# Patient Record
Sex: Female | Born: 1965 | Race: White | Hispanic: No | Marital: Married | Smoking: Never smoker
Health system: Southern US, Community
[De-identification: ages and names within clinical notes are randomized; demographics above are authoritative.]

## PROBLEM LIST (undated history)

## (undated) DIAGNOSIS — R51 Headache: Secondary | ICD-10-CM

## (undated) DIAGNOSIS — C801 Malignant (primary) neoplasm, unspecified: Secondary | ICD-10-CM

## (undated) DIAGNOSIS — M797 Fibromyalgia: Secondary | ICD-10-CM

## (undated) DIAGNOSIS — F419 Anxiety disorder, unspecified: Secondary | ICD-10-CM

## (undated) DIAGNOSIS — K219 Gastro-esophageal reflux disease without esophagitis: Secondary | ICD-10-CM

## (undated) DIAGNOSIS — E7800 Pure hypercholesterolemia, unspecified: Secondary | ICD-10-CM

## (undated) DIAGNOSIS — E785 Hyperlipidemia, unspecified: Secondary | ICD-10-CM

## (undated) HISTORY — DX: Pure hypercholesterolemia, unspecified: E78.00

## (undated) HISTORY — PX: ENDOMETRIAL ABLATION: SHX621

## (undated) HISTORY — PX: HERNIA REPAIR: SHX51

## (undated) HISTORY — PX: WISDOM TOOTH EXTRACTION: SHX21

## (undated) HISTORY — PX: TUBAL LIGATION: SHX77

## (undated) HISTORY — DX: Hyperlipidemia, unspecified: E78.5

## (undated) HISTORY — DX: Fibromyalgia: M79.7

---

## 1992-03-17 HISTORY — PX: GYNECOLOGIC CRYOSURGERY: SHX857

## 2000-05-04 ENCOUNTER — Inpatient Hospital Stay (HOSPITAL_COMMUNITY): Admission: AD | Admit: 2000-05-04 | Discharge: 2000-05-04 | Payer: Self-pay | Admitting: Gynecology

## 2000-05-10 ENCOUNTER — Inpatient Hospital Stay (HOSPITAL_COMMUNITY): Admission: AD | Admit: 2000-05-10 | Discharge: 2000-05-10 | Payer: Self-pay | Admitting: *Deleted

## 2000-05-12 ENCOUNTER — Encounter (INDEPENDENT_AMBULATORY_CARE_PROVIDER_SITE_OTHER): Payer: Self-pay

## 2000-05-12 ENCOUNTER — Inpatient Hospital Stay (HOSPITAL_COMMUNITY): Admission: AD | Admit: 2000-05-12 | Discharge: 2000-05-17 | Payer: Self-pay | Admitting: Gynecology

## 2000-05-16 ENCOUNTER — Encounter: Payer: Self-pay | Admitting: Gynecology

## 2000-08-14 ENCOUNTER — Ambulatory Visit (HOSPITAL_BASED_OUTPATIENT_CLINIC_OR_DEPARTMENT_OTHER): Admission: RE | Admit: 2000-08-14 | Discharge: 2000-08-14 | Payer: Self-pay | Admitting: Surgery

## 2001-10-28 ENCOUNTER — Ambulatory Visit (HOSPITAL_COMMUNITY): Admission: RE | Admit: 2001-10-28 | Discharge: 2001-10-28 | Payer: Self-pay | Admitting: *Deleted

## 2001-10-28 ENCOUNTER — Encounter: Payer: Self-pay | Admitting: *Deleted

## 2003-10-05 ENCOUNTER — Other Ambulatory Visit: Admission: RE | Admit: 2003-10-05 | Discharge: 2003-10-05 | Payer: Self-pay | Admitting: Gynecology

## 2004-10-04 ENCOUNTER — Emergency Department (HOSPITAL_COMMUNITY): Admission: EM | Admit: 2004-10-04 | Discharge: 2004-10-05 | Payer: Self-pay | Admitting: Emergency Medicine

## 2006-01-20 ENCOUNTER — Other Ambulatory Visit: Admission: RE | Admit: 2006-01-20 | Discharge: 2006-01-20 | Payer: Self-pay | Admitting: Gynecology

## 2006-10-23 ENCOUNTER — Ambulatory Visit (HOSPITAL_COMMUNITY): Admission: RE | Admit: 2006-10-23 | Discharge: 2006-10-23 | Payer: Self-pay | Admitting: Gynecology

## 2006-12-07 ENCOUNTER — Encounter: Admission: RE | Admit: 2006-12-07 | Discharge: 2006-12-07 | Payer: Self-pay

## 2007-01-16 HISTORY — PX: OTHER SURGICAL HISTORY: SHX169

## 2007-08-07 ENCOUNTER — Emergency Department (HOSPITAL_COMMUNITY): Admission: EM | Admit: 2007-08-07 | Discharge: 2007-08-07 | Payer: Self-pay | Admitting: Family Medicine

## 2007-08-07 ENCOUNTER — Ambulatory Visit (HOSPITAL_COMMUNITY): Admission: RE | Admit: 2007-08-07 | Discharge: 2007-08-07 | Payer: Self-pay | Admitting: Family Medicine

## 2007-10-10 ENCOUNTER — Emergency Department (HOSPITAL_COMMUNITY): Admission: EM | Admit: 2007-10-10 | Discharge: 2007-10-10 | Payer: Self-pay | Admitting: Emergency Medicine

## 2008-01-27 ENCOUNTER — Other Ambulatory Visit: Admission: RE | Admit: 2008-01-27 | Discharge: 2008-01-27 | Payer: Self-pay | Admitting: Gynecology

## 2008-01-27 ENCOUNTER — Encounter: Payer: Self-pay | Admitting: Gynecology

## 2008-01-27 ENCOUNTER — Ambulatory Visit: Payer: Self-pay | Admitting: Gynecology

## 2008-01-31 ENCOUNTER — Ambulatory Visit: Payer: Self-pay | Admitting: Gynecology

## 2008-08-02 ENCOUNTER — Ambulatory Visit: Payer: Self-pay | Admitting: Gynecology

## 2009-09-28 ENCOUNTER — Ambulatory Visit: Payer: Self-pay | Admitting: Gynecology

## 2009-09-28 ENCOUNTER — Other Ambulatory Visit: Admission: RE | Admit: 2009-09-28 | Discharge: 2009-09-28 | Payer: Self-pay | Admitting: Gynecology

## 2010-06-10 ENCOUNTER — Other Ambulatory Visit: Payer: Self-pay | Admitting: Family Medicine

## 2010-06-10 ENCOUNTER — Ambulatory Visit
Admission: RE | Admit: 2010-06-10 | Discharge: 2010-06-10 | Disposition: A | Discharge status: Home / Self Care | Payer: BC Managed Care – PPO | Source: Ambulatory Visit | Attending: Family Medicine | Admitting: Family Medicine

## 2010-06-10 DIAGNOSIS — M25559 Pain in unspecified hip: Secondary | ICD-10-CM

## 2010-08-02 NOTE — Op Note (Signed)
[place]  Patient:    [name], [name]                         MRN: [id number] Proc. Date: 08/14/00 Adm. Date:  [id number] Attending:  Katha Cabal CC:         [facility]  Dr. Frederico Hamman   Operative Report  PREOPERATIVE DIAGNOSIS:  Umbilical hernia.  POSTOPERATIVE DIAGNOSIS:  Umbilical hernia.  PROCEDURE:  Umbilical herniorrhaphy with mesh.  SURGEON:  Thornton Park. Daphine Deutscher, M.D.  ANESTHESIA:  General by endotracheal.  DESCRIPTION OF PROCEDURE:  [name] is a 45 year old lady that after her last baby had a fairly prominent umbilical hernia.  She has some postpartum gravidarum pigmentation along the midline and down into the umbilicus and about a 2 fingerbreadth weakness.  [name] was taken to room 6 at [facility] on May 31 and given general anesthesia.  She got a g of Ancef preoperatively.  The abdomen was prepped with Betadine and draped sterilely.  I made a curvilinear incision beneath her umbilicus and lifted the umbilical skin off of the hernia to a point.  It was pretty well broadly fused and I went ahead and opened the hernia and then took the skin of the umbilicus off this.  I put my finger down into the defect and took the fat and moved it away, but nevertheless was in the abdomen and I felt that placement of mesh at this level would lead to adhesion formation potentially.  Therefore, I cut a piece of mesh to fit and incorporated the mesh on the outside on the exterior aspect of the fascia and placed several 0 Prolene pop-off sutures through and through fascia and closed it in a horizontal fashion.  The middle two were in sort of a vest-over-pants fashion and this did create a nice secure closure using a total of six sutures.  I then injected the area with Marcaine.  I washed with a basin of saline and then tacked the inner aspect of the umbilical fold down to the fascia itself, not to the mesh  and then I closed the skin with 4-0 Vicryl subcuticular and subcutaneously with benzoin and Steri-Strips to give final approximation to the skin.  A sterile dressing was applied.  The patient was given ______ to take for pain and will be seen in the office in two weeks. DD:  08/14/00 TD:  08/14/00 Job: [id number]

## 2010-08-02 NOTE — Discharge Summary (Signed)
[facility]  Patient:    [name], [name]                         MRN: [id number] Adm. Date:  [id number] Disc. Date: [id number] Attending:  Tonye Royalty Dictator:   Antony Contras, NP                           Discharge Summary  DISCHARGE DIAGNOSES:          1. Preterm labor and delivery at 36-1/2 weeks.                               2. History of previous low cervical transverse                                  cesarean section.                               3. Nonreassuring fetal heart tracing.                               4. Arrest of labor.  PROCEDURES:                   Low cervical transverse cesarean section with bilateral tubal sterilization.  HISTORY OF PRESENT ILLNESS:   Patient is a 45 year old gravida 3, para 1-0-1-1 with EDC of June 05, 2000 by ultrasound.  Prenatal risk factors include history of a nonimmune rubella status, previous low cervical transverse cesarean section, fibroid uterus and umbilical hernia.  LABORATORY DATA:              Labs are as follows:  Blood type O positive, antibody screen negative, RPR, HBsAg, HIV nonreactive, rubella nonimmune, GBS is negative.  HOSPITAL COURSE/TREATMENT:    Patient was admitted on February _26, 2002 with increased contractions and was found to be 3-4 cm, 90% effaced, with vertex station.  She quickly progressed to 7-8 cm.  During the second stage of labor, she did experience a nonreassuring fetal heart tracing and it was decided by patient and the Gerome Kokesh to proceed with cesarean section.  Low cervical transverse cesarean section and bilateral tubal sterilization was performed by Dr. Lily Peer under epidural anesthesia.  Patient delivered an Apgar 6/9 female infant weighing 6 pounds 10 ounces.  Findings included multiple leiomyomata, also umbilical cord was wrapped x 3 around the left ankle.  POSTOPERATIVE COURSE:         Patient did sustain a postoperative  temperature elevation to 100.4 but this did resolve spontaneously.  She also had some difficulty with initiating a bowel movement and required magnesium citrate and Dulcolax suppository for this; otherwise, she had a benign postoperative course.  CBC: Hematocrit was 27.3, hemoglobin 9.4, wbc 13.5, platelets 161. She was able to be discharged in satisfactory condition on her fourth postoperative day.  She did receive rubella vaccine prior to discharge.  DISPOSITION:                  Follow up in six weeks.  MEDICATIONS:                  Continue prenatal vitamins and  iron.  Motrin and Tylox for pain. DD:  06/01/00 TD:  06/02/00 Job: [id number]

## 2010-08-02 NOTE — H&P (Signed)
[facility]  PatientTAJUANNA, [name] Visit Number: [id number] MRN: [id number]          Service Type: OBS Location: [facility] Attending Physician:  Wetzel Bjornstad Admit Date:  05/10/2000 Discharge Date: 05/10/2000                           History and Physical  CHIEF COMPLAINT:              Contractions.  HISTORY OF PRESENT ILLNESS:   The patient is a 45 year old gravida 3, para 1, AB1, with a corrected  estimated date of confinement based on ultrasound June 05, 2000.  The patient is currently 36-4/[redacted] weeks gestation.  She presented to [facility] today complaining of having increased contractions.  She has been complaining of contractions on and off for the past two weeks and was seen this weekend in the emergency room at [facility] and was in false labor and was sent home with instructions.  Review of her prenatal records indicated that she has been treated no several occasions for bacterial vaginosis or yeast infection as well as urinary tract infection.  She also has had modified rest at home and cut down on working hours due to the fact that she had straining of her cervical length measurement back at 32-1/[redacted] weeks gestation.  On her arrival to the emergency room today, she was found to be contracting every 1-1/2 to 2 minutes apart and her cervix was found to be 3-4 cm dilated, 90% effaced, vertex presentation, -1 with intact membranes and afebrile.  She is brought to labor and delivery and upon arrival and examination at 2230 hours she was found to be 7-8 cm dilated, completely effaced and 0 station with intact membranes.  She underwent rupture of membranes with clear amniotic fluid.  ALLERGIES:  She is allergic to PENICILLIN, AMPICILLIN, TETRACYCLINE.  PAST SURGICAL HISTORY:  She had a previous lower uterine segment transverse cesarean section secondary to fetal distress and which was attributed to a umbilical cord up around the  newborns neck causing deceleration and resulting in emergency cesarean section.  She did have a spontaneous VBAC back in 1994 as well.  She has had previous history of cryotherapy in 1995 secondary to dysplasia.  She had cystoscopy in 1994 and during the prenatal visit she was noticed also to have a reducible umbilical hernia on an early ultrasound and anterior uterine fibroid.  REVIEW OF SYSTEMS:  See Hollister form.  PHYSICAL EXAMINATION:  VITAL SIGNS:                    Temperature 97.6, pulse 77, respirations 22, blood pressure 127/82.  HEENT:                          Unremarkable.  NECK:                           Supple.  Trachea midline.  No carotid bruits. No thyromegaly.  LUNGS:                          Clear to auscultation without rhonchi or wheezes.  HEART:                          Regular rate and rhythm, no  murmurs or gallops.  BREASTS:                        Breast examination done during the first trimester and was reported to be normal.  ABDOMEN:                        Gravid uterus, 39 cm, vertex presentation by Thayer Ohm maneuver.  Positive fetal heart tones in the 120-130 beat range.  PELVIC:                         Cervix 7-8 cm dilated, complete effacement, 0 station.  EXTREMITIES:                    Deep tendon reflexes 1+, negative clonus and trace edema.  PRENATAL LABORATORY DATA:       Blood type is O positive, negative antibody screen.  VDRL was nonreactive.  Rubella was negative.  Hepatitis B surface antigen and HIV were negative.  Pap smear was normal.  Alpha fetoprotein was normal.  Blood sugar screen was normal and GBS culture was negative. Of note, the patient was tested at 32 weeks for Fifths disease due to the fact that she had been exposed at school.  From recollection of office visit, there was no evidence that she had evidence of acute infection although I do not have clear documentation and a copy of the Hollister form in my  possession at the time of this dictation.  LABORATORY DATA:                Admission labs today are hemoglobin and hematocrit of 12.2 and 35.3 with a platelet count of 188,000.  ASSESSMENT:                     [age]-year-old gravida 3, para 1 at 36-4/[redacted] weeks gestation with an atypical dilatation in active labor. Contractions are every 1-1/2 minutes apart with a reassuring fetal heart rate tracing.  The patient underwent artificial rupture of membranes.  Clear amniotic fluid was present.  Fetal scalp electrode was placed.  Vital signs are stable.  The patient is afebrile.  She had an epidural and was resting comfortably.  Previous cesarean section with documented lower uterine segment transverse incision.  Patient was previously counseled as to benefits, risks, pros and cons of a trial of labor versus repeat cesarean section and opted to proceed with at attempt at [facility].  The GBS culture is negative.  PLAN:                           Anticipate a vaginal delivery.  Follow up visit in six weeks will be coordinated with general surgery for an umbilical herniorrhaphy at which time we may proceed also for elective permanent sterilization that the patient had requested as well. Attending Physician:  Wetzel Bjornstad DD:  05/12/00 TD:  05/12/00 Job: [id number]

## 2010-08-02 NOTE — Op Note (Signed)
[facility]  Patient:    [name], [name]                         MRN: [id number] Proc. Date: 05/13/00 Adm. Date:  [id number] Attending:  Tonye Royalty                           Operative Report  PREOPERATIVE DIAGNOSIS:       1. Preterm at 36-1/2 weeks in labor.                               2. Nonreassuring fetal heart rate tracing                                  (deep variables/late deceleration).                               3. Previous cesarean section for trial of labor.                                  Failed vaginal birth after cesarean section.                               4. Request for elective permanent sterilization.  POSTOPERATIVE DIAGNOSIS:      1. Preterm at 36-1/2 weeks in labor.                               2. Multiple leiomyomas.  OPERATION:                    1. Repeat lower uterine segment transverse                                  cesarean section.                               2. Bilateral tubal sterilization procedure by                                  Barnett Abu technique.  SURGEON:                      Juan H. Lily Peer, M.D.  ASSISTANT:                    Beather Arbour. Thomasena Edis, M.D.  ANESTHESIA:                   Epidural.  FINDINGS:                     A viable female infant with Apgars 6/9 with a weight of 6 pounds 10 ounces.  Arterial cord pH 7.19.  Normal tubes no acute distress ovaries.  Multiple leiomyomas.  INDICATIONS:  A 45 year old gravida 2, para 1, at 36-1/2 weeks estimated gestational age with spontaneous labor prematurely, advanced to complete dilatation and was pushing for approximately an hour and one-half. She was demonstrating on fetal heart rate monitor deep variable decelerations with slow rise with overshoot of baseline when decreased variability, heart rate dropping to as low as 50 to 60 beats per minute.  Fetal head unable to descend further than a +1 station.  DESCRIPTION OF  PROCEDURE:     After the patient was adequately counselled, she was taken to the operating room where she was prepped and draped in the usual sterile fashion.  Foley catheter had previously been placed in labor and delivery, and epidural was on board as well.  She did receive 0.25 mg subcutaneously of terbutaline prior to transfer to the operating room and was continuously being given oxygen until she was brought to the operating room. After the drapes were in placed, the Pfannenstiel skin incision was made adjacent to the previous Pfannenstiel scar.  The incision was carried down through skin and subcutaneous tissue down to the rectus fascia whereby midline nick was made.  The fascia was incised in a transverse fashion.  The peritoneal cavity was entered, and the bladder flap was established.  The lower uterine segment was incised in transverse fashion.  Clear amniotic fluid was present.  The newborn head was delivered.  The nasopharyngeal air was bulb suctioned.  The newborn was delivered.  At this point, it was noted that the baby had a short umbilical cord and had the cord wrapped around the ankle three times which was manually reduced, clamped, cut, and passed off the the pediatricians who gave the above-mentioned parameters.  After cord blood was obtained, the placenta was delivered from the intrauterine cavity.  Due to the multiple leiomyomas and the size of the uterus, it was unable to be exteriorized, and the old uterine segment transverse incision was closed in a first layer interlocking stitch of 0 Vicryl suture followed by a second layer of 0 Vicryl suture in an imbricating manner.  After this, attention was turned to the left fallopian tube which was brought into view, and the proximal one-third portion was grasped with a Babcock clamp, and a 2 cm segment was suture ligated with a 3-0 Vicryl suture x 2, and this segment was excised. The remaining stump were Bovie cauterized.  A  similar procedure was carried out on the contralateral side after assessing hemostasis.  Attention then was turned to the lower uterine segment.  The pelvic cavity was then copiously irrigated with normal saline solution after reassuring that there was adequate hemostasis and sponge and needle count correct.  The fascia was then closed with a running stitch of 0 Vicryl suture.  The subcutaneous bleeders were Bovie cauterized.  The skin was reapproximated with skin clips followed by placement of Xeroform gauze and 4 x 4 dressing.  The patient was transferred to the recovery room with stable vital signs.  She received Unasyn 900 mg IV after the cord was clamped and cut.  Blood loss from the procedure was 600 cc.  IV fluid 2000 cc of lactated Ringers, and urine output was 200 cc and clear. DD:  05/13/00 TD:  05/13/00 Job: [id number]

## 2010-12-13 LAB — DIFFERENTIAL
Basophils Absolute: 0.1
Basophils Relative: 1
Eosinophils Relative: 3
Lymphocytes Relative: 22
Neutro Abs: 2.8

## 2010-12-13 LAB — POCT I-STAT, CHEM 8
Glucose, Bld: 95
HCT: 42
Hemoglobin: 14.3
Potassium: 3.7
Sodium: 138
TCO2: 23

## 2010-12-13 LAB — CBC
MCHC: 33.3
Platelets: 195
RDW: 12.5

## 2010-12-13 LAB — URINALYSIS, ROUTINE W REFLEX MICROSCOPIC
Bilirubin Urine: NEGATIVE
Hgb urine dipstick: NEGATIVE
Ketones, ur: 40 — AB
Nitrite: NEGATIVE
Protein, ur: NEGATIVE
Specific Gravity, Urine: 1.028
Urobilinogen, UA: 1

## 2010-12-13 LAB — POCT PREGNANCY, URINE
Operator id: 29452
Preg Test, Ur: NEGATIVE

## 2010-12-13 LAB — PROTIME-INR: Prothrombin Time: 13.1

## 2010-12-13 LAB — URINE MICROSCOPIC-ADD ON

## 2011-09-08 ENCOUNTER — Other Ambulatory Visit: Payer: Self-pay | Admitting: Gynecology

## 2011-09-08 DIAGNOSIS — Z1231 Encounter for screening mammogram for malignant neoplasm of breast: Secondary | ICD-10-CM

## 2011-09-30 ENCOUNTER — Ambulatory Visit (HOSPITAL_COMMUNITY)
Admission: RE | Admit: 2011-09-30 | Discharge: 2011-09-30 | Disposition: A | Discharge status: Home / Self Care | Payer: BC Managed Care – PPO | Source: Ambulatory Visit | Attending: Gynecology | Admitting: Gynecology

## 2011-09-30 DIAGNOSIS — Z1231 Encounter for screening mammogram for malignant neoplasm of breast: Secondary | ICD-10-CM | POA: Insufficient documentation

## 2011-10-03 ENCOUNTER — Ambulatory Visit (INDEPENDENT_AMBULATORY_CARE_PROVIDER_SITE_OTHER): Payer: BC Managed Care – PPO | Admitting: Gynecology

## 2011-10-03 ENCOUNTER — Other Ambulatory Visit (HOSPITAL_COMMUNITY)
Admission: RE | Admit: 2011-10-03 | Discharge: 2011-10-03 | Disposition: A | Discharge status: Home / Self Care | Payer: BC Managed Care – PPO | Source: Ambulatory Visit | Attending: Gynecology | Admitting: Gynecology

## 2011-10-03 ENCOUNTER — Encounter: Payer: Self-pay | Admitting: Gynecology

## 2011-10-03 VITALS — BP 110/64 | Ht 62.5 in | Wt 141.0 lb

## 2011-10-03 DIAGNOSIS — M797 Fibromyalgia: Secondary | ICD-10-CM | POA: Insufficient documentation

## 2011-10-03 DIAGNOSIS — Z01419 Encounter for gynecological examination (general) (routine) without abnormal findings: Secondary | ICD-10-CM

## 2011-10-03 DIAGNOSIS — N879 Dysplasia of cervix uteri, unspecified: Secondary | ICD-10-CM | POA: Insufficient documentation

## 2011-10-03 DIAGNOSIS — Z1151 Encounter for screening for human papillomavirus (HPV): Secondary | ICD-10-CM | POA: Insufficient documentation

## 2011-10-03 NOTE — Progress Notes (Signed)
[name] [birth year] [id number]        46 y.o.  [id number] for annual exam.  Several issues noted below.  Past medical history,surgical history, medications, allergies, family history and social history were all reviewed and documented in the EPIC chart. ROS:  Was performed and pertinent positives and negatives are included in the history.  Exam: [name] assistant Filed Vitals:   10/03/11 1116  BP: 110/64  Height: 5' 2.5" (1.588 m)  Weight: 141 lb (63.957 kg)   General appearance  Normal Skin grossly normal Head/Neck normal with no cervical or supraclavicular adenopathy thyroid normal Lungs  clear Cardiac RR, without RMG Abdominal  soft, nontender, without masses, organomegaly or hernia Breasts  examined lying and sitting without masses, retractions, discharge or axillary adenopathy. Pelvic  Ext/BUS/vagina  normal   Cervix  normal Pap/HPV  Uterus  anteverted, normal size, shape and contour, midline and mobile nontender   Adnexa  Without masses or tenderness    Anus and perineum  normal   Rectovaginal  normal sphincter tone without palpated masses or tenderness.    Assessment/Plan:  46 y.o. [id number] female for annual exam, regular menses, tubal sterilization.   1. History ablation. Regular lighter menses. We'll continue to monitor. 2. History of dysplasia. History of dysplasia with cryosurgery 1994. Pap smears since then have been normal. Last Pap 2011. Pap/HPV done today. Assuming normal then plan every 5 year screening per current screening guidelines. 3. Mammography.  Patient just had mammogram June 2013. She'll continue with annual mammography. SBE monthly reviewed. 4. Health maintenance. No blood work was done today this is all done through her primary physician who she sees on regular basis. Assuming she continues well from a gynecologic standpoint she will see me in a year, sooner as needed.    [name] MD, 12:19 PM 10/03/2011

## 2011-10-03 NOTE — Patient Instructions (Signed)
Follow up in one year for annual gynecologic exam. 

## 2012-04-07 ENCOUNTER — Ambulatory Visit: Payer: BC Managed Care – PPO | Attending: Sports Medicine | Admitting: Physical Therapy

## 2012-04-07 DIAGNOSIS — IMO0001 Reserved for inherently not codable concepts without codable children: Secondary | ICD-10-CM | POA: Insufficient documentation

## 2012-04-07 DIAGNOSIS — M25519 Pain in unspecified shoulder: Secondary | ICD-10-CM | POA: Insufficient documentation

## 2012-04-07 DIAGNOSIS — M25619 Stiffness of unspecified shoulder, not elsewhere classified: Secondary | ICD-10-CM | POA: Insufficient documentation

## 2012-04-09 ENCOUNTER — Ambulatory Visit: Payer: BC Managed Care – PPO | Admitting: Physical Therapy

## 2012-04-12 ENCOUNTER — Ambulatory Visit: Payer: BC Managed Care – PPO | Admitting: Physical Therapy

## 2012-04-15 ENCOUNTER — Ambulatory Visit: Payer: BC Managed Care – PPO | Admitting: Physical Therapy

## 2012-04-19 ENCOUNTER — Ambulatory Visit: Payer: BC Managed Care – PPO | Attending: Sports Medicine | Admitting: Physical Therapy

## 2012-04-19 DIAGNOSIS — M25619 Stiffness of unspecified shoulder, not elsewhere classified: Secondary | ICD-10-CM | POA: Insufficient documentation

## 2012-04-19 DIAGNOSIS — IMO0001 Reserved for inherently not codable concepts without codable children: Secondary | ICD-10-CM | POA: Insufficient documentation

## 2012-04-19 DIAGNOSIS — M25519 Pain in unspecified shoulder: Secondary | ICD-10-CM | POA: Insufficient documentation

## 2012-04-22 ENCOUNTER — Ambulatory Visit: Payer: BC Managed Care – PPO | Admitting: Physical Therapy

## 2012-04-26 ENCOUNTER — Ambulatory Visit: Payer: BC Managed Care – PPO | Admitting: Physical Therapy

## 2012-04-29 ENCOUNTER — Ambulatory Visit: Payer: BC Managed Care – PPO | Admitting: Physical Therapy

## 2012-05-03 ENCOUNTER — Ambulatory Visit: Payer: BC Managed Care – PPO | Admitting: Physical Therapy

## 2012-05-06 ENCOUNTER — Ambulatory Visit: Payer: BC Managed Care – PPO | Admitting: Physical Therapy

## 2012-05-10 ENCOUNTER — Ambulatory Visit: Payer: BC Managed Care – PPO | Admitting: Physical Therapy

## 2012-05-13 ENCOUNTER — Ambulatory Visit: Payer: BC Managed Care – PPO | Admitting: Physical Therapy

## 2012-05-19 ENCOUNTER — Ambulatory Visit: Payer: BC Managed Care – PPO | Attending: Sports Medicine | Admitting: Physical Therapy

## 2012-05-19 DIAGNOSIS — IMO0001 Reserved for inherently not codable concepts without codable children: Secondary | ICD-10-CM | POA: Insufficient documentation

## 2012-05-19 DIAGNOSIS — M25519 Pain in unspecified shoulder: Secondary | ICD-10-CM | POA: Insufficient documentation

## 2012-05-19 DIAGNOSIS — M25619 Stiffness of unspecified shoulder, not elsewhere classified: Secondary | ICD-10-CM | POA: Insufficient documentation

## 2012-10-13 ENCOUNTER — Encounter: Payer: Self-pay | Admitting: Gynecology

## 2012-10-13 ENCOUNTER — Ambulatory Visit (INDEPENDENT_AMBULATORY_CARE_PROVIDER_SITE_OTHER): Payer: BC Managed Care – PPO | Admitting: Gynecology

## 2012-10-13 VITALS — BP 106/68 | Ht 62.5 in | Wt 141.0 lb

## 2012-10-13 DIAGNOSIS — N898 Other specified noninflammatory disorders of vagina: Secondary | ICD-10-CM

## 2012-10-13 DIAGNOSIS — A499 Bacterial infection, unspecified: Secondary | ICD-10-CM

## 2012-10-13 DIAGNOSIS — Z01419 Encounter for gynecological examination (general) (routine) without abnormal findings: Secondary | ICD-10-CM

## 2012-10-13 DIAGNOSIS — R102 Pelvic and perineal pain: Secondary | ICD-10-CM | POA: Insufficient documentation

## 2012-10-13 DIAGNOSIS — N760 Acute vaginitis: Secondary | ICD-10-CM

## 2012-10-13 DIAGNOSIS — N949 Unspecified condition associated with female genital organs and menstrual cycle: Secondary | ICD-10-CM

## 2012-10-13 DIAGNOSIS — D259 Leiomyoma of uterus, unspecified: Secondary | ICD-10-CM

## 2012-10-13 DIAGNOSIS — B9689 Other specified bacterial agents as the cause of diseases classified elsewhere: Secondary | ICD-10-CM

## 2012-10-13 LAB — WET PREP FOR TRICH, YEAST, CLUE
Clue Cells Wet Prep HPF POC: NONE SEEN
Trich, Wet Prep: NONE SEEN

## 2012-10-13 MED ORDER — METRONIDAZOLE 500 MG PO TABS: 500.0000 mg | ORAL_TABLET | Freq: Two times a day (BID) | ORAL | Status: DC

## 2012-10-13 MED ORDER — FLUCONAZOLE 100 MG PO TABS: ORAL_TABLET | ORAL | Status: DC

## 2012-10-13 NOTE — Patient Instructions (Addendum)
Transvaginal Ultrasound Transvaginal ultrasound is a pelvic ultrasound, using a metal probe that is placed in the vagina, to look at a women's female organs. Transvaginal ultrasound is a method of seeing inside the pelvis of a woman. The ultrasound machine sends out sound waves from the transducer (probe). These sound waves bounce off body structures (like an echo) to create a picture. The picture shows up on a monitor. It is called transvaginal because the probe is inserted into the vagina. There should be very little discomfort from the vaginal probe. This test can also be used during pregnancy. Endovaginal ultrasound is another name for a transvaginal ultrasound. In a transabdominal ultrasound, the probe is placed on the outside of the belly. This method gives pictures that are lower quality than pictures from the transvaginal technique. Transvaginal ultrasound is used to look for problems of the female genital tract. Some such problems include:  Infertility problems.  Congenital (birth defect) malformations of the uterus and ovaries.  Tumors in the uterus.  Abnormal bleeding.  Ovarian tumors and cysts.  Abscess (inflamed tissue around pus) in the pelvis.  Unexplained abdominal or pelvic pain.  Pelvic infection. DURING PREGNANCY, TRANSVAGINAL ULTRASOUND MAY BE USED TO LOOK AT:  Normal pregnancy.  Ectopic pregnancy (pregnancy outside the uterus).  Fetal heartbeat.  Abnormalities in the pelvis, that are not seen well with transabdominal ultrasound.  Suspected twins or multiples.  Impending miscarriage.  Problems with the cervix (incompetent cervix, not able to stay closed and hold the baby).  When doing an amniocentesis (removing fluid from the pregnancy sac, for testing).  Looking for abnormalities of the baby.  Checking the growth, development, and age of the fetus.  Measuring the amount of fluid in the amniotic sac.  When doing an external version of the baby (moving  baby into correct position).  Evaluating the baby for problems in high risk pregnancies (biophysical profile).  Suspected fetal demise (death). Sometimes a special ultrasound method called Saline Infusion Sonography (SIS) is used for a more accurate look at the uterus. Sterile saline (salt water) is injected into the uterus of non-pregnant patients to see the inside of the uterus better. SIS is not used on pregnant women. The vaginal probe can also assist in obtaining biopsies of abnormal areas, in draining fluid from cysts on the ovary, and in finding IUDs (intrauterine device, birth control) that cannot be located. PREPARATION FOR TEST A transvaginal ultrasound is done with the bladder empty. The transabdominal ultrasound is done with your bladder full. You may be asked to drink several glasses of water before that exam. Sometimes, a transabdominal ultrasound is done just after a transvaginal ultrasound, to look at organs in your abdomen. PROCEDURE  You will lie down on a table, with your knees bent and your feet in foot holders. The probe is covered with a condom. A sterile lubricant is put into the vagina and on the probe. The lubricant helps transmit the sound waves and avoid irritating the vagina. Your caregiver will move the probe inside the vaginal cavity to scan the pelvic structures. A normal test will show a normal pelvis and normal contents. An abnormal test will show abnormalities of the pelvis, placenta, or baby. ABNORMAL RESULTS MAY BE DUE TO:  Growths or tumors in the:  Uterus.  Ovaries.  Vagina.  Other pelvic structures.  Non-cancerous growths of the uterus and ovaries.  Twisting of the ovary, cutting off blood supply to the ovary (ovarian torsion).  Areas of infection, including:  Pelvic  inflammatory disease.  Abscess in the pelvis.  Locating an IUD. PROBLEMS FOUND IN PREGNANT WOMEN MAY INCLUDE:  Ectopic pregnancy (pregnancy outside the uterus).  Multiple  pregnancies.  Early dilation (opening) of the cervix. This may indicate an incompetent cervix and early delivery.  Impending miscarriage.  Fetal death.  Problems with the placenta, including:  Placenta has grown over the opening of the womb (placenta previa).  Placenta has separated early in the womb (placental abruption).  Placenta grows into the muscle of the uterus (placenta accreta).  Tumors of pregnancy, including gestational trophoblastic disease. This is an abnormal pregnancy, with no fetus. The uterus is filled with many grape-like cysts that could sometimes be cancerous.  Incorrect position of the fetus (breech, vertex).  Intrauterine fetal growth retardation (IUGR) (poor growth in the womb).  Fetal abnormalities or infection. RISKS AND COMPLICATIONS There are no known risks to the ultrasound procedure. There is no X-ray used when doing an ultrasound. Document Released: 02/13/2004 Document Revised: 05/26/2011 Document Reviewed: 01/31/2009 [facility] [place] Patient Information 2014 [place], [place]. Bacterial Vaginosis Bacterial vaginosis (BV) is a vaginal infection where the normal balance of bacteria in the vagina is disrupted. The normal balance is then replaced by an overgrowth of certain bacteria. There are several different kinds of bacteria that can cause BV. BV is the most common vaginal infection in women of childbearing age. CAUSES   The cause of BV is not fully understood. BV develops when there is an increase or imbalance of harmful bacteria.  Some activities or behaviors can upset the normal balance of bacteria in the vagina and put women at increased risk including:  Having a new sex partner or multiple sex partners.  Douching.  Using an intrauterine device (IUD) for contraception.  It is not clear what role sexual activity plays in the development of BV. However, women that have never had sexual intercourse are rarely infected with BV. Women do not get BV from  toilet seats, bedding, swimming pools or from touching objects around them.  SYMPTOMS   Grey vaginal discharge.  A fish-like odor with discharge, especially after sexual intercourse.  Itching or burning of the vagina and vulva.  Burning or pain with urination.  Some women have no signs or symptoms at all. DIAGNOSIS  Your caregiver must examine the vagina for signs of BV. Your caregiver will perform lab tests and look at the sample of vaginal fluid through a microscope. They will look for bacteria and abnormal cells (clue cells), a pH test higher than 4.5, and a positive amine test all associated with BV.  RISKS AND COMPLICATIONS   Pelvic inflammatory disease (PID).  Infections following gynecology surgery.  Developing HIV.  Developing herpes virus. TREATMENT  Sometimes BV will clear up without treatment. However, all women with symptoms of BV should be treated to avoid complications, especially if gynecology surgery is planned. Female partners generally do not need to be treated. However, BV may spread between female sex partners so treatment is helpful in preventing a recurrence of BV.   BV may be treated with antibiotics. The antibiotics come in either pill or vaginal cream forms. Either can be used with nonpregnant or pregnant women, but the recommended dosages differ. These antibiotics are not harmful to the baby.  BV can recur after treatment. If this happens, a second round of antibiotics will often be prescribed.  Treatment is important for pregnant women. If not treated, BV can cause a premature delivery, especially for a pregnant woman who had a  premature birth in the past. All pregnant women who have symptoms of BV should be checked and treated.  For chronic reoccurrence of BV, treatment with a type of prescribed gel vaginally twice a week is helpful. HOME CARE INSTRUCTIONS   Finish all medication as directed by your caregiver.  Do not have sex until treatment is  completed.  Tell your sexual partner that you have a vaginal infection. They should see their caregiver and be treated if they have problems, such as a mild rash or itching.  Practice safe sex. Use condoms. Only have 1 sex partner. PREVENTION  Basic prevention steps can help reduce the risk of upsetting the natural balance of bacteria in the vagina and developing BV:  Do not have sexual intercourse (be abstinent).  Do not douche.  Use all of the medicine prescribed for treatment of BV, even if the signs and symptoms go away.  Tell your sex partner if you have BV. That way, they can be treated, if needed, to prevent reoccurrence. SEEK MEDICAL CARE IF:   Your symptoms are not improving after 3 days of treatment.  You have increased discharge, pain, or fever. MAKE SURE YOU:   Understand these instructions.  Will watch your condition.  Will get help right away if you are not doing well or get worse. FOR MORE INFORMATION  Division of STD Prevention (DSTDP), Centers for Disease Control and Prevention: [employer] American Social Health Association (ASHA): www.ashastd.org  Document Released: 03/03/2005 Document Revised: 05/26/2011 Document Reviewed: 08/24/2008 [facility] Patient Information 2014 [place], [place].

## 2012-10-13 NOTE — Progress Notes (Signed)
[name] [place] 1966/02/04 [id number]   History:    47 y.o.  for annual gyn exam was also complaining on and off right lower quadrant discomfort for several months along with a vaginal discharge with odor. Review of patient's records indicated she said a prior tubal sterilization procedure. She also said a history of endometrial ablation in 2008. She stated her cycles are now lasting about 5 days. 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 She had a negative Pap smear with negative HPV last year. The new guidelines have been discussed with her. Patient is in a monogamous relationship. 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  Past medical history,surgical history, family history and social history were all reviewed and documented in the EPIC chart.  Gynecologic History Patient's last menstrual period was 09/29/2012. Contraception: tubal ligation Last Pap: 2013. Results were: normal Last mammogram: 2013. Results were: normal but dense  Obstetric History OB History   Grav Para Term Preterm Abortions TAB SAB Ect Mult Living   3 2 2  1  1   2      # Outc Date GA Lbr Len/2nd Wgt Sex Del Anes PTL Lv   1 TRM            2 TRM            3 SAB                ROS: A ROS was performed and pertinent positives and negatives are included in the history.  GENERAL: No fevers or chills. HEENT: No change in vision, no earache, sore throat or sinus congestion. NECK: No pain or stiffness. CARDIOVASCULAR: No chest pain or pressure. No palpitations. PULMONARY: No shortness of breath, cough or wheeze. GASTROINTESTINAL: Right lower quadrant discomfort pulling sensation on and off not regular GENITOURINARY: No urinary frequency, urgency, hesitancy or dysuria. MUSCULOSKELETAL: No joint or muscle pain, no back pain, no recent trauma. DERMATOLOGIC: No rash, no itching, no lesions. ENDOCRINE: No polyuria, polydipsia, no heat or cold intolerance. No  recent change in weight. HEMATOLOGICAL: No anemia or easy bruising or bleeding. NEUROLOGIC: No headache, seizures, numbness, tingling or weakness. PSYCHIATRIC: No depression, no loss of interest in normal activity or change in sleep pattern.     Exam: chaperone present  BP 106/68  Ht 5' 2.5" (1.588 m)  Wt 141 lb (63.957 kg)  BMI 25.36 kg/m2  LMP 09/29/2012  Body mass index is 25.36 kg/(m^2).  General appearance : Well developed well nourished female. No acute distress HEENT: Neck supple, trachea midline, no carotid bruits, no thyroidmegaly Lungs: Clear to auscultation, no rhonchi or wheezes, or rib retractions  Heart: Regular rate and rhythm, no murmurs or gallops Breast:Examined in sitting and supine position were symmetrical in appearance, no palpable masses or tenderness,  no skin retraction, no nipple inversion, no nipple discharge, no skin discoloration, no axillary or supraclavicular lymphadenopathy Abdomen: no palpable masses or tenderness, no rebound or guarding Extremities: no edema or skin discoloration or tenderness  Pelvic:  Bartholin, Urethra, Skene Glands: Within normal limits             Vagina: No gross lesions or discharge  Cervix: No gross lesions or discharge  Uterus  Retroverted normal size shape and consistency Adnexa  Tenderness right lower quadrant no discernible large masses palpated Anus and perineum  normal   Rectovaginal  normal sphincter tone without palpated masses or tenderness  Hemoccult none indicated   Wet prep her yeast, moderate WBC and few bacteria  Assessment/Plan:  47 y.o. female for annual exam patient will be treated for suspected BV with Flagyl 500 mg one by mouth twice a day for 5 days. Patient's primary physician has been doing her lab work. Patient will return back to the office for a transvaginal ultrasound. We discussed importance of monthly self breast examination. When she schedules her mammograms she is going to request a 3-D  because last years mammogram demonstrated that her breasts were very dense. No Pap smear done today the new guidelines discussed. We discussed importance of calcium vitamin D and regular exercise for osteoporosis prevention.    Ok Edwards MD, 6:23 PM 10/13/2012

## 2012-10-14 ENCOUNTER — Encounter: Payer: Self-pay | Admitting: Gynecology

## 2012-10-22 ENCOUNTER — Ambulatory Visit (INDEPENDENT_AMBULATORY_CARE_PROVIDER_SITE_OTHER): Payer: BC Managed Care – PPO | Admitting: Gynecology

## 2012-10-22 ENCOUNTER — Ambulatory Visit (INDEPENDENT_AMBULATORY_CARE_PROVIDER_SITE_OTHER): Payer: BC Managed Care – PPO

## 2012-10-22 DIAGNOSIS — R102 Pelvic and perineal pain: Secondary | ICD-10-CM

## 2012-10-22 DIAGNOSIS — D259 Leiomyoma of uterus, unspecified: Secondary | ICD-10-CM

## 2012-10-22 DIAGNOSIS — N949 Unspecified condition associated with female genital organs and menstrual cycle: Secondary | ICD-10-CM

## 2012-10-22 NOTE — Progress Notes (Signed)
Patient presented to the office for an ultrasound. She was seen in the office on July 30 of this year for her annual exam.during that office visit patient stated she was complaining on and off for right lower quadrant discomfort for several months and had a vaginal discharge with odor.she was treated for bacterial vaginosis for 5 days and is doing well now.the patient has prior tubal sterilization procedure as well as endometrial ablation in 2008. Patient was asymptomatic today.  Ultrasound today demonstrated uterus measured 9.6 x 9.9 x 8.9 cm with endometrial stripe 6.8 mm. Patient had 5 fibroids the largest one measuring 45 x 39 mm. Both ovaries were normal.  Assessment/plan: Patient with several small fibroids. Patient will continue to monitor in her symptoms worsen we did discuss offer her total laparoscopic hysterectomy with ovarian conservation. Literature information was provided.

## 2012-10-22 NOTE — Patient Instructions (Addendum)
Total Laparoscopic Hysterectomy A total laparoscopic hysterectomy is a minimally invasive surgery to remove your uterus and cervix. This surgery is performed by making several small cuts (incisions) in your abdomen. It can also be done with a thin, lighted tube (laparoscope) inserted into 2 small incisions in the lower abdomen. Your fallopian tubes and ovaries can be removed (bilateral salpingo-oopherectomy) during this surgery as well.If a total laparoscopic hysterectomy is started and it is not safe to continue, the laparoscopic surgery will be converted to an open abdominal surgery. You will not have menstrual periods or be able to get pregnant after having this surgery. If a bilateral salpingo-oopherectomy was performed before menopause, you will go through a sudden (abrupt) menopause. This can be helped with hormone medicines. Benefits of minimally invasive surgery include:  Less pain.  Less risk of blood loss.  Less risk of infection.  Quicker return to normal activities.  Usually a 1 night stay in the hospital.  Overall patient satisfaction. LET YOUR CAREGIVER KNOW ABOUT:  Any history of abnormal Pap tests.  Allergies to food or medicine.  Medicines taken, including vitamins, herbs, eyedrops, over-the-counter medicines, and creams.  Use of steroids (by mouth or creams).  Previous problems with anesthetics or numbing medicines.  History of bleeding problems or blood clots.  Previous surgery.  Other health problems, including diabetes and kidney problems.  Desire for future fertility.  Any infections or colds you may have developed.  Symptoms of irregular or heavy periods, weight loss, or urinary or bowel changes. RISKS AND COMPLICATIONS   Bleeding.  Blood clots in the legs or lung.  Infection.  Injury to surrounding organs.  Problems with anesthesia.  Early menopause symptoms (hot flashes, night sweats, insomnia).  Risk of conversion to an open abdominal  incision. BEFORE THE PROCEDURE  Ask your caregiver about changing or stopping your regular medicines.  Do not take aspirin or blood thinners (anticoagulants) for 1 week before the surgery, or as told by your caregiver.  Do not eat or drink anything for 8 hours before the surgery, or as told by your caregiver.  Quit smoking if you smoke.  Arrange for a ride home after surgery and for someone to help you at home during recovery. PROCEDURE   You will be given antibiotic medicine.  An intravenous (IV) line will be placed in your arm. You will be given medicine to make you sleep (general anesthetic).  A gas (carbon dioxide) will be used to inflate your abdomen. This will allow your surgeon to look inside your abdomen, perform your surgery, and treat any other problems found if necessary.  Three or four small incisions (often less than  inch) will be made in your abdomen. One of these incisions will be made in the area of your belly button (navel). The laparoscope will be inserted into the incision. Your surgeon will look through the laparoscope while doing your procedure.  Other surgical instruments will be inserted through the other incisions.  The uterus may be removed through the vagina or cut into small pieces and removed through the small incisions.  Your incisions will be closed. AFTER THE PROCEDURE  The gas will be released from inside your abdomen.  You will be taken to the recovery area where a nurse will watch and check your progress. Once you are awake, stable, and taking fluids well, without other problems, you will return to your room or be allowed to go home.  There is usually minimal discomfort following the surgery because   the incisions are so small.  You will be given pain medicine while you are in the hospital and for when you go home.  Try to have someone with you the first 3 to 5 days after you go home.  Follow up with your surgeon in 2 to 4 weeks after surgery  to evaluate your progress. Document Released: 12/29/2006 Document Revised: 05/26/2011 Document Reviewed: 10/18/2010 [employer] Patient Information 2014 [employer]. Fibroids Fibroids are lumps (tumors) that can occur any place in a woman's body. These lumps are not cancerous. Fibroids vary in size, weight, and where they grow. HOME CARE  Do not take aspirin.  Write down the number of pads or tampons you use during your period. Tell your doctor. This can help determine the best treatment for you. GET HELP RIGHT AWAY IF:  You have pain in your lower belly (abdomen) that is not helped with medicine.  You have cramps that are not helped with medicine.  You have more bleeding between or during your period.  You feel lightheaded or pass out (faint).  Your lower belly pain gets worse. MAKE SURE YOU:  Understand these instructions.  Will watch your condition.  Will get help right away if you are not doing well or get worse. Document Released: 04/05/2010 Document Revised: 05/26/2011 Document Reviewed: 04/05/2010 [employer] Patient Information 2014 [employer].  

## 2013-06-27 ENCOUNTER — Encounter: Payer: Self-pay | Admitting: Gynecology

## 2013-06-27 ENCOUNTER — Ambulatory Visit (INDEPENDENT_AMBULATORY_CARE_PROVIDER_SITE_OTHER): Payer: BC Managed Care – PPO | Admitting: Gynecology

## 2013-06-27 VITALS — BP 110/68

## 2013-06-27 DIAGNOSIS — D259 Leiomyoma of uterus, unspecified: Secondary | ICD-10-CM

## 2013-06-27 NOTE — Progress Notes (Signed)
   The patient presented to the office today complaining of lower, discomfort and nodular-like areas in her abdomen that she is able to palpate depending on changing position. Patient with known history of leiomyomatous uteri. She was seen in the office last in August of 2014 and an ultrasound had been done which demonstrated the following:  Ultrasound today demonstrated uterus measured 9.6 x 9.9 x 8.9 cm with endometrial stripe 6.8 mm. Patient had 5 fibroids the largest one measuring 45 x 39 mm. Both ovaries were normal.  Patient with prior tubal sterilization procedure and having normal menstrual cycle. Patient also had endometrial ablation in 2008.  Exam: Abdomen: Soft nontender no rebound guarding top portion of the regular uterus was able to be palpated over her abdomen. Patient with 2 Pfannenstiel scars and a large subumbilical incision scar as well. 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 Patient with prior history of umbilical hernia we care x2 last time requiring mesh placement. Literature and information on fibroid uterus hysterectomy was provided. We will see her a week before surgery into an ultrasound as well to compare from previous study.

## 2013-06-27 NOTE — Patient Instructions (Signed)
Fibroids Fibroids are lumps (tumors) that can occur any place in a woman's body. These lumps are not cancerous. Fibroids vary in size, weight, and where they grow. HOME CARE  Do not take aspirin.  Write down the number of pads or tampons you use during your period. Tell your doctor. This can help determine the best treatment for you. GET HELP RIGHT AWAY IF:  You have pain in your lower belly (abdomen) that is not helped with medicine.  You have cramps that are not helped with medicine.  You have more bleeding between or during your period.  You feel lightheaded or pass out (faint).  Your lower belly pain gets worse. MAKE SURE YOU:  Understand these instructions.  Will watch your condition.  Will get help right away if you are not doing well or get worse. Document Released: 04/05/2010 Document Revised: 05/26/2011 Document Reviewed: 04/05/2010 Exit[employer] Patient Information 2014 [employer]. Hysterectomy Information  A hysterectomy is a surgery in which your uterus is removed. This surgery may be done to treat various medical problems. After the surgery, you will no longer have menstrual periods. The surgery will also make you unable to become pregnant (sterile). The fallopian tubes and ovaries can be removed (bilateral salpingo-oophorectomy) during this surgery as well.  REASONS FOR A HYSTERECTOMY  Persistent, abnormal bleeding.  Lasting (chronic) pelvic pain or infection.  The lining of the uterus (endometrium) starts growing outside the uterus (endometriosis).  The endometrium starts growing in the muscle of the uterus (adenomyosis).  The uterus falls down into the vagina (pelvic organ prolapse).  Noncancerous growths in the uterus (uterine fibroids) that cause symptoms.  Precancerous cells.  Cervical cancer or uterine cancer. TYPES OF HYSTERECTOMIES  Supracervical hysterectomy In this type, the top part of the uterus is removed, but not the cervix.  Total  hysterectomy The uterus and cervix are removed.  Radical hysterectomy The uterus, the cervix, and the fibrous tissue that holds the uterus in place in the pelvis (parametrium) are removed. WAYS A HYSTERECTOMY CAN BE PERFORMED  Abdominal hysterectomy A large surgical cut (incision) is made in the abdomen. The uterus is removed through this incision.  Vaginal hysterectomy An incision is made in the vagina. The uterus is removed through this incision. There are no abdominal incisions.  Conventional laparoscopic hysterectomy Three or four small incisions are made in the abdomen. A thin, lighted tube with a camera (laparoscope) is inserted into one of the incisions. Other tools are put through the other incisions. The uterus is cut into small pieces. The small pieces are removed through the incisions, or they are removed through the vagina.  Laparoscopically assisted vaginal hysterectomy (LAVH) Three or four small incisions are made in the abdomen. Part of the surgery is performed laparoscopically and part vaginally. The uterus is removed through the vagina.  Robot-assisted laparoscopic hysterectomy A laparoscope and other tools are inserted into 3 or 4 small incisions in the abdomen. A computer-controlled device is used to give the surgeon a 3D image and to help control the surgical instruments. This allows for more precise movements of surgical instruments. The uterus is cut into small pieces and removed through the incisions or removed through the vagina. RISKS AND COMPLICATIONS  Possible complications associated with this procedure include:  Bleeding and risk of blood transfusion. Tell your health care provider if you do not want to receive any blood products.  Blood clots in the legs or lung.  Infection.  Injury to surrounding organs.  Problems or   side effects related to anesthesia.  Conversion to an abdominal hysterectomy from one of the other techniques. WHAT TO EXPECT AFTER A  HYSTERECTOMY  You will be given pain medicine.  You will need to have someone with you for the first 3 5 days after you go home.  You will need to follow up with your surgeon in 2 4 weeks after surgery to evaluate your progress.  You may have early menopause symptoms such as hot flashes, night sweats, and insomnia.  If you had a hysterectomy for a problem that was not cancer or not a condition that could lead to cancer, then you no longer need Pap tests. However, even if you no longer need a Pap test, a regular exam is a good idea to make sure no other problems are starting. Document Released: 08/27/2000 Document Revised: 12/22/2012 Document Reviewed: 11/08/2012 ExitCare Patient Information 2014 [employer].  

## 2013-06-29 ENCOUNTER — Telehealth: Payer: Self-pay

## 2013-06-29 NOTE — Telephone Encounter (Signed)
Left message for patient to call me on home ans machine.

## 2013-06-30 ENCOUNTER — Telehealth: Payer: Self-pay

## 2013-06-30 NOTE — Telephone Encounter (Signed)
Patient called to schedule surgery stating Dr. Moshe Salisbury wanted to do it ASAP and she would like to schedule the week of May 10. I had already consulted both Dr. Schedule and OR schedule and first available is June 2.  Patient was informed of this.  This did not work well with her schedule and she was not pleased about it. She said to go ahead and schedule June 2 and she may just have to cancel.  I did tell her I will let her know if I have any cancellation sooner where I could schedule her sooner.  Surgery scheduled for June 2.  I will be calling her back to schedule U/s and pre op consult for a week prior to surgery.

## 2013-07-11 ENCOUNTER — Other Ambulatory Visit: Payer: Self-pay | Admitting: Gynecology

## 2013-07-11 DIAGNOSIS — D259 Leiomyoma of uterus, unspecified: Secondary | ICD-10-CM

## 2013-08-05 ENCOUNTER — Other Ambulatory Visit (HOSPITAL_COMMUNITY)
Admission: RE | Admit: 2013-08-05 | Discharge: 2013-08-05 | Disposition: A | Discharge status: Home / Self Care | Payer: BC Managed Care – PPO | Source: Ambulatory Visit | Attending: Gynecology | Admitting: Gynecology

## 2013-08-05 ENCOUNTER — Other Ambulatory Visit: Payer: BC Managed Care – PPO

## 2013-08-05 ENCOUNTER — Ambulatory Visit: Payer: BC Managed Care – PPO | Admitting: Gynecology

## 2013-08-05 ENCOUNTER — Ambulatory Visit (INDEPENDENT_AMBULATORY_CARE_PROVIDER_SITE_OTHER): Payer: BC Managed Care – PPO | Admitting: Gynecology

## 2013-08-05 ENCOUNTER — Ambulatory Visit (INDEPENDENT_AMBULATORY_CARE_PROVIDER_SITE_OTHER): Payer: BC Managed Care – PPO

## 2013-08-05 ENCOUNTER — Encounter: Payer: Self-pay | Admitting: Gynecology

## 2013-08-05 ENCOUNTER — Other Ambulatory Visit: Payer: Self-pay | Admitting: Gynecology

## 2013-08-05 VITALS — BP 102/68

## 2013-08-05 DIAGNOSIS — D259 Leiomyoma of uterus, unspecified: Secondary | ICD-10-CM

## 2013-08-05 DIAGNOSIS — N852 Hypertrophy of uterus: Secondary | ICD-10-CM

## 2013-08-05 DIAGNOSIS — N920 Excessive and frequent menstruation with regular cycle: Secondary | ICD-10-CM

## 2013-08-05 DIAGNOSIS — IMO0002 Reserved for concepts with insufficient information to code with codable children: Secondary | ICD-10-CM | POA: Insufficient documentation

## 2013-08-05 DIAGNOSIS — Z01419 Encounter for gynecological examination (general) (routine) without abnormal findings: Secondary | ICD-10-CM | POA: Insufficient documentation

## 2013-08-05 DIAGNOSIS — Z124 Encounter for screening for malignant neoplasm of cervix: Secondary | ICD-10-CM

## 2013-08-05 DIAGNOSIS — Z803 Family history of malignant neoplasm of breast: Secondary | ICD-10-CM

## 2013-08-05 DIAGNOSIS — Z01818 Encounter for other preprocedural examination: Secondary | ICD-10-CM

## 2013-08-05 NOTE — Progress Notes (Signed)
[name] is an 48 y.o. female. Presented to the office today for preop exam. Patient is scheduled for total abdominal hysterectomy with bilateral salpingectomy and ovarian conservation in the next few weeks. Patient has been complaining of lower abdominal discomfort and she has been able to palpate her uterine fibroids transabdominally. She is also complaining dysmenorrhea and menorrhagia.  Patient had an ultrasound today which demonstrated the following: Uterus measures 12 x 11 x 8.6 cm endometrial stripe a 10 mm. Patient had a total of 6 fibroids the largest one measuring 65 a 51 mm. Ovaries appeared to be normal.  Patient with prior tubal sterilization procedure and having normal menstrual cycle. Patient also had endometrial ablation in 2008. Patient has 2 abdominal Pfannenstiel scars from previous cesarean section and a large solid Vicryl scar as well.    Pertinent Gynecological History: Menses: DUB Bleeding: dysfunctional uterine bleeding Contraception: tubal ligation DES exposure: unknown Blood transfusions: none Sexually transmitted diseases: no past history Previous GYN Procedures: c sedt, Cryo of cervix,BTSP, endometrial ablation  Last mammogram: normal Date: 2013 Last pap: normal Date: 2013 OB History: G2, P2   Menstrual History: Menarche age: 52  Patient's last menstrual period was 07/08/2013.    Past Medical History  Diagnosis Date  . Elevated cholesterol   . Fibromyalgia     Past Surgical History  Procedure Laterality Date  . Cesarean section      X 2  . Tubal ligation    . Her option  01/2007  . Endometrial ablation    . Hernia repair    . Gynecologic cryosurgery  1994    Family History  Problem Relation Age of Onset  . Heart disease Father   . Hypertension Maternal Grandmother   . Breast cancer Maternal Grandmother   . Breast cancer Paternal Grandmother     Social History:  reports that she has never smoked. She does not have any smokeless  tobacco history on file. She reports that she drinks alcohol. She reports that she does not use illicit drugs.  Allergies:  Allergies  Allergen Reactions  . Caffeine   . Penicillins   . Tetracyclines & Related   . Yellow Dyes (Non-Tartrazine)     Headaches     (Not in a hospital admission)  REVIEW OF SYSTEMS: A ROS was performed and pertinent positives and negatives are included in the history.  GENERAL: No fevers or chills. HEENT: No change in vision, no earache, sore throat or sinus congestion. NECK: No pain or stiffness. CARDIOVASCULAR: No chest pain or pressure. No palpitations. PULMONARY: No shortness of breath, cough or wheeze. GASTROINTESTINAL: No abdominal pain, nausea, vomiting or diarrhea, melena or bright red blood per rectum. GENITOURINARY: No urinary frequency, urgency, hesitancy or dysuria. MUSCULOSKELETAL: No joint or muscle pain, no back pain, no recent trauma. DERMATOLOGIC: No rash, no itching, no lesions. ENDOCRINE: No polyuria, polydipsia, no heat or cold intolerance. No recent change in weight. HEMATOLOGICAL: No anemia or easy bruising or bleeding. NEUROLOGIC: No headache, seizures, numbness, tingling or weakness. PSYCHIATRIC: No depression, no loss of interest in normal activity or change in sleep pattern.     Blood pressure 102/68, last menstrual period 07/08/2013.  Physical Exam:  HEENT:unremarkable Neck:Supple, midline, no thyroid megaly, no carotid bruits Lungs:  Clear to auscultation no rhonchi's or wheezes Heart:Regular rate and rhythm, no murmurs or gallops Breast Exam:No palpable masses or tenderness  Abdomen:Tender fibroid uterus felt approximately 3 fingerbreadths above the symphysis pubis  Pelvic:BUSwithin normal limits  Vagina:No  lesions or discharge very narrow  Cervix:No lesions or discharge  Uterus:To 14 week size irregular tender uterus  Adnexa:Difficult to evaluate due to size of uterus  Extremities: No cords, no  edema Rectal:Unremarkable   Assessment/Plan: With symptomatic leiomyomatous uteri scheduled for total abdominal hysterectomy with bilateral salpingectomy and ovarian conservation. Patient with history of umbilical hernia repair x2 as well as 2 large Pfannenstiel scar from previous cesarean sections. This along with a narrow vagina we decided to proceed with an abdominal hysterectomy approach. The risk of the procedure were discussed as follows:                        Patient was counseled as to the risk of surgery to include the following:  1. Infection (prohylactic antibiotics will be administered)  2. DVT/Pulmonary Embolism (prophylactic pneumo compression stockings will be used)  3.Trauma to internal organs requiring additional surgical procedure to repair any injury to     Internal organs requiring perhaps additional hospitalization days.  4.Hemmorhage requiring transfusion and blood products which carry risks such as   anaphylactic reaction, hepatitis and AIDS  Patient had received literature information on the procedure scheduled and all her questions were answered and fully accepts all risk.   Starlyn Skeans FernandezMD1:35 PMTD@Note : This dictation was prepared with  Dragon/digital dictation along withSmart phrase technology. Any transcriptional errors that result from this process are unintentional.      [name] 08/05/2013, 11:36 AM  Note: This dictation was prepared with  Dragon/digital dictation along withSmart phrase technology. Any transcriptional errors that result from this process are unintentional.

## 2013-08-15 ENCOUNTER — Encounter (HOSPITAL_COMMUNITY): Payer: Self-pay

## 2013-08-15 ENCOUNTER — Encounter (HOSPITAL_COMMUNITY)
Admission: RE | Admit: 2013-08-15 | Discharge: 2013-08-15 | Disposition: A | Discharge status: Home / Self Care | Payer: BC Managed Care – PPO | Source: Ambulatory Visit | Attending: Gynecology | Admitting: Gynecology

## 2013-08-15 HISTORY — DX: Anxiety disorder, unspecified: F41.9

## 2013-08-15 HISTORY — DX: Gastro-esophageal reflux disease without esophagitis: K21.9

## 2013-08-15 HISTORY — DX: Headache: R51

## 2013-08-15 LAB — CBC
HCT: 40.8 % (ref 36.0–46.0)
Hemoglobin: 13.6 g/dL (ref 12.0–15.0)
MCH: 28.8 pg (ref 26.0–34.0)
MCHC: 33.3 g/dL (ref 30.0–36.0)
MCV: 86.4 fL (ref 78.0–100.0)
PLATELETS: 213 10*3/uL (ref 150–400)
RBC: 4.72 MIL/uL (ref 3.87–5.11)
RDW: 13.3 % (ref 11.5–15.5)
WBC: 6.5 10*3/uL (ref 4.0–10.5)

## 2013-08-15 LAB — URINALYSIS, ROUTINE W REFLEX MICROSCOPIC
Bilirubin Urine: NEGATIVE
Glucose, UA: NEGATIVE mg/dL
Hgb urine dipstick: NEGATIVE
Ketones, ur: NEGATIVE mg/dL
LEUKOCYTES UA: NEGATIVE
NITRITE: NEGATIVE
Protein, ur: NEGATIVE mg/dL
SPECIFIC GRAVITY, URINE: 1.015 (ref 1.005–1.030)
Urobilinogen, UA: 0.2 mg/dL (ref 0.0–1.0)
pH: 5.5 (ref 5.0–8.0)

## 2013-08-15 MED ORDER — GENTAMICIN SULFATE 40 MG/ML IJ SOLN
INTRAVENOUS | Status: AC
  Administered 2013-08-16: 07:00:00 via INTRAVENOUS
  Filled 2013-08-15: qty 7

## 2013-08-15 NOTE — Patient Instructions (Addendum)
   Your procedure is scheduled on:  Tuesday, June 2  Enter through the [employer] of [facility] at: 6 AM Pick up the phone at the desk and dial [phone] and inform us of your arrival.  Please call this number if you have any problems the morning of surgery: [phone]  Remember: Do not eat or drink after midnight: Monday (tonight) Take these medicines the morning of surgery with a SIP OF WATER: paxil  Do not wear jewelry, make-up, or FINGER nail polish No metal in your hair or on your body. Do not wear lotions, powders, perfumes.  You may wear deodorant.  Do not bring valuables to the hospital. Contacts, dentures or bridgework may not be worn into surgery.  Leave suitcase in the car. After Surgery it may be brought to your room. For patients being admitted to the hospital, checkout time is 11:00am the day of discharge.  Home with husband Catalina Antigua

## 2013-08-16 ENCOUNTER — Encounter (HOSPITAL_COMMUNITY): Payer: BC Managed Care – PPO | Admitting: Anesthesiology

## 2013-08-16 ENCOUNTER — Encounter (HOSPITAL_COMMUNITY)
Admission: RE | Disposition: A | Discharge status: Home / Self Care | Payer: Self-pay | Source: Ambulatory Visit | Attending: Gynecology

## 2013-08-16 ENCOUNTER — Encounter (HOSPITAL_COMMUNITY): Payer: Self-pay | Admitting: *Deleted

## 2013-08-16 ENCOUNTER — Inpatient Hospital Stay (HOSPITAL_COMMUNITY): Payer: BC Managed Care – PPO | Admitting: Anesthesiology

## 2013-08-16 ENCOUNTER — Inpatient Hospital Stay (HOSPITAL_COMMUNITY)
Admission: RE | Admit: 2013-08-16 | Discharge: 2013-08-17 | DRG: 743 | Disposition: A | Discharge status: Home / Self Care | Payer: BC Managed Care – PPO | Source: Ambulatory Visit | Attending: Gynecology | Admitting: Gynecology

## 2013-08-16 DIAGNOSIS — E7800 Pure hypercholesterolemia, unspecified: Secondary | ICD-10-CM | POA: Diagnosis present

## 2013-08-16 DIAGNOSIS — N949 Unspecified condition associated with female genital organs and menstrual cycle: Secondary | ICD-10-CM | POA: Diagnosis present

## 2013-08-16 DIAGNOSIS — N920 Excessive and frequent menstruation with regular cycle: Secondary | ICD-10-CM | POA: Diagnosis present

## 2013-08-16 DIAGNOSIS — N946 Dysmenorrhea, unspecified: Secondary | ICD-10-CM | POA: Diagnosis present

## 2013-08-16 DIAGNOSIS — N938 Other specified abnormal uterine and vaginal bleeding: Secondary | ICD-10-CM

## 2013-08-16 DIAGNOSIS — D259 Leiomyoma of uterus, unspecified: Secondary | ICD-10-CM

## 2013-08-16 DIAGNOSIS — K219 Gastro-esophageal reflux disease without esophagitis: Secondary | ICD-10-CM | POA: Diagnosis present

## 2013-08-16 DIAGNOSIS — D251 Intramural leiomyoma of uterus: Principal | ICD-10-CM | POA: Diagnosis present

## 2013-08-16 DIAGNOSIS — Z803 Family history of malignant neoplasm of breast: Secondary | ICD-10-CM

## 2013-08-16 DIAGNOSIS — F411 Generalized anxiety disorder: Secondary | ICD-10-CM | POA: Diagnosis present

## 2013-08-16 DIAGNOSIS — IMO0001 Reserved for inherently not codable concepts without codable children: Secondary | ICD-10-CM | POA: Diagnosis present

## 2013-08-16 DIAGNOSIS — Z9889 Other specified postprocedural states: Secondary | ICD-10-CM

## 2013-08-16 DIAGNOSIS — D287 Benign neoplasm of other specified female genital organs: Secondary | ICD-10-CM

## 2013-08-16 DIAGNOSIS — D267 Other benign neoplasm of other parts of uterus: Secondary | ICD-10-CM

## 2013-08-16 DIAGNOSIS — Z8249 Family history of ischemic heart disease and other diseases of the circulatory system: Secondary | ICD-10-CM

## 2013-08-16 HISTORY — PX: ABDOMINAL HYSTERECTOMY: SHX81

## 2013-08-16 HISTORY — PX: BILATERAL SALPINGECTOMY: SHX5743

## 2013-08-16 LAB — PREGNANCY, URINE: Preg Test, Ur: NEGATIVE

## 2013-08-16 SURGERY — HYSTERECTOMY, ABDOMINAL
Anesthesia: General | Site: Abdomen

## 2013-08-16 MED ORDER — BUPIVACAINE HCL (PF) 0.25 % IJ SOLN
INTRAMUSCULAR | Status: AC
  Filled 2013-08-16: qty 30

## 2013-08-16 MED ORDER — FENTANYL CITRATE 0.05 MG/ML IJ SOLN
INTRAMUSCULAR | Status: AC
  Filled 2013-08-16: qty 5

## 2013-08-16 MED ORDER — 0.9 % SODIUM CHLORIDE (POUR BTL) OPTIME
TOPICAL | Status: DC | PRN
  Administered 2013-08-16 (×2): 1000 mL

## 2013-08-16 MED ORDER — HYDROMORPHONE HCL PF 1 MG/ML IJ SOLN
0.2500 mg | INTRAMUSCULAR | Status: DC | PRN
  Administered 2013-08-16 (×4): 0.5 mg via INTRAVENOUS

## 2013-08-16 MED ORDER — LACTATED RINGERS IV SOLN
INTRAVENOUS | Status: DC
  Administered 2013-08-16 – 2013-08-17 (×2): via INTRAVENOUS

## 2013-08-16 MED ORDER — LACTATED RINGERS IV SOLN
INTRAVENOUS | Status: DC | PRN
  Administered 2013-08-16 (×2): via INTRAVENOUS

## 2013-08-16 MED ORDER — ONDANSETRON HCL 4 MG/2ML IJ SOLN
INTRAMUSCULAR | Status: DC | PRN
  Administered 2013-08-16: 4 mg via INTRAVENOUS

## 2013-08-16 MED ORDER — OXYCODONE-ACETAMINOPHEN 5-325 MG PO TABS
1.0000 | ORAL_TABLET | Freq: Four times a day (QID) | ORAL | Status: DC | PRN
  Administered 2013-08-17: 2 via ORAL
  Administered 2013-08-17: 1 via ORAL
  Filled 2013-08-16 (×2): qty 1
  Filled 2013-08-16: qty 2

## 2013-08-16 MED ORDER — LIDOCAINE HCL (PF) 1 % IJ SOLN
INTRAMUSCULAR | Status: AC
  Filled 2013-08-16: qty 5

## 2013-08-16 MED ORDER — DEXAMETHASONE SODIUM PHOSPHATE 10 MG/ML IJ SOLN
INTRAMUSCULAR | Status: DC | PRN
  Administered 2013-08-16: 10 mg via INTRAVENOUS

## 2013-08-16 MED ORDER — ROCURONIUM BROMIDE 100 MG/10ML IV SOLN
INTRAVENOUS | Status: AC
  Filled 2013-08-16: qty 1

## 2013-08-16 MED ORDER — METOCLOPRAMIDE HCL 5 MG/ML IJ SOLN: 10.0000 mg | Freq: Once | INTRAMUSCULAR | Status: DC | PRN

## 2013-08-16 MED ORDER — EPHEDRINE SULFATE 50 MG/ML IJ SOLN
INTRAMUSCULAR | Status: DC | PRN
  Administered 2013-08-16: 2.5 mg via INTRAVENOUS

## 2013-08-16 MED ORDER — FENTANYL CITRATE 0.05 MG/ML IJ SOLN
INTRAMUSCULAR | Status: DC | PRN
  Administered 2013-08-16: 250 ug via INTRAVENOUS

## 2013-08-16 MED ORDER — HYDROMORPHONE HCL PF 1 MG/ML IJ SOLN
INTRAMUSCULAR | Status: AC
  Filled 2013-08-16: qty 1

## 2013-08-16 MED ORDER — MIDAZOLAM HCL 2 MG/2ML IJ SOLN
INTRAMUSCULAR | Status: AC
  Filled 2013-08-16: qty 2

## 2013-08-16 MED ORDER — BUPIVACAINE LIPOSOME 1.3 % IJ SUSP
20.0000 mL | Freq: Once | INTRAMUSCULAR | Status: DC
  Filled 2013-08-16: qty 20

## 2013-08-16 MED ORDER — SODIUM CHLORIDE 0.9 % IJ SOLN: 9.0000 mL | INTRAMUSCULAR | Status: DC | PRN

## 2013-08-16 MED ORDER — ACETAMINOPHEN 160 MG/5ML PO SOLN
ORAL | Status: AC
  Filled 2013-08-16: qty 40.6

## 2013-08-16 MED ORDER — DEXAMETHASONE SODIUM PHOSPHATE 10 MG/ML IJ SOLN
INTRAMUSCULAR | Status: AC
  Filled 2013-08-16: qty 1

## 2013-08-16 MED ORDER — SODIUM CHLORIDE 0.9 % IJ SOLN
INTRAMUSCULAR | Status: AC
  Filled 2013-08-16: qty 30

## 2013-08-16 MED ORDER — ROCURONIUM BROMIDE 100 MG/10ML IV SOLN
INTRAVENOUS | Status: DC | PRN
  Administered 2013-08-16: 40 mg via INTRAVENOUS

## 2013-08-16 MED ORDER — GLYCOPYRROLATE 0.2 MG/ML IJ SOLN
INTRAMUSCULAR | Status: DC | PRN
  Administered 2013-08-16: 0.4 mg via INTRAVENOUS
  Administered 2013-08-16: 0.1 mg via INTRAVENOUS

## 2013-08-16 MED ORDER — KETOROLAC TROMETHAMINE 30 MG/ML IJ SOLN: 15.0000 mg | Freq: Once | INTRAMUSCULAR | Status: DC | PRN

## 2013-08-16 MED ORDER — NEOSTIGMINE METHYLSULFATE 10 MG/10ML IV SOLN
INTRAVENOUS | Status: DC | PRN
  Administered 2013-08-16: 3 mg via INTRAVENOUS

## 2013-08-16 MED ORDER — MORPHINE SULFATE (PF) 1 MG/ML IV SOLN
INTRAVENOUS | Status: DC
  Administered 2013-08-16 (×2): 2 mg via INTRAVENOUS
  Administered 2013-08-16: 11:00:00 via INTRAVENOUS
  Administered 2013-08-17: 5 mg via INTRAVENOUS
  Administered 2013-08-17: 6 mg via INTRAVENOUS
  Filled 2013-08-16: qty 25

## 2013-08-16 MED ORDER — LIDOCAINE HCL (CARDIAC) 20 MG/ML IV SOLN
INTRAVENOUS | Status: DC | PRN
  Administered 2013-08-16: 50 mg via INTRAVENOUS

## 2013-08-16 MED ORDER — PROPOFOL 10 MG/ML IV BOLUS
INTRAVENOUS | Status: DC | PRN
  Administered 2013-08-16: 150 mg via INTRAVENOUS

## 2013-08-16 MED ORDER — DIPHENHYDRAMINE HCL 50 MG/ML IJ SOLN: 12.5000 mg | Freq: Four times a day (QID) | INTRAMUSCULAR | Status: DC | PRN

## 2013-08-16 MED ORDER — ONDANSETRON HCL 4 MG/2ML IJ SOLN
INTRAMUSCULAR | Status: AC
  Filled 2013-08-16: qty 2

## 2013-08-16 MED ORDER — PAROXETINE HCL 10 MG PO TABS
5.0000 mg | ORAL_TABLET | Freq: Every day | ORAL | Status: DC
  Administered 2013-08-17: 5 mg via ORAL
  Filled 2013-08-16 (×3): qty 0.5

## 2013-08-16 MED ORDER — GLYCOPYRROLATE 0.2 MG/ML IJ SOLN
INTRAMUSCULAR | Status: AC
  Filled 2013-08-16: qty 3

## 2013-08-16 MED ORDER — PHENYLEPHRINE HCL 10 MG/ML IJ SOLN
INTRAMUSCULAR | Status: DC | PRN
  Administered 2013-08-16: 0.1 mg via INTRAVENOUS
  Administered 2013-08-16 (×3): .04 mg via INTRAVENOUS

## 2013-08-16 MED ORDER — DIPHENHYDRAMINE HCL 12.5 MG/5ML PO ELIX
12.5000 mg | ORAL_SOLUTION | Freq: Four times a day (QID) | ORAL | Status: DC | PRN
Start: 2013-08-16 — End: 2013-08-17
  Filled 2013-08-16: qty 5

## 2013-08-16 MED ORDER — PROPOFOL 10 MG/ML IV EMUL
INTRAVENOUS | Status: AC
  Filled 2013-08-16: qty 20

## 2013-08-16 MED ORDER — EPHEDRINE SULFATE 50 MG/ML IJ SOLN
INTRAMUSCULAR | Status: AC
  Filled 2013-08-16: qty 1

## 2013-08-16 MED ORDER — MEPERIDINE HCL 25 MG/ML IJ SOLN: 6.2500 mg | INTRAMUSCULAR | Status: DC | PRN

## 2013-08-16 MED ORDER — ACETAMINOPHEN 160 MG/5ML PO SOLN
975.0000 mg | Freq: Once | ORAL | Status: AC
  Administered 2013-08-16: 975 mg via ORAL

## 2013-08-16 MED ORDER — ONDANSETRON HCL 4 MG/2ML IJ SOLN: 4.0000 mg | Freq: Four times a day (QID) | INTRAMUSCULAR | Status: DC | PRN

## 2013-08-16 MED ORDER — BUPIVACAINE LIPOSOME 1.3 % IJ SUSP
INTRAMUSCULAR | Status: DC | PRN
  Administered 2013-08-16: 20 mL

## 2013-08-16 MED ORDER — NALOXONE HCL 0.4 MG/ML IJ SOLN: 0.4000 mg | INTRAMUSCULAR | Status: DC | PRN

## 2013-08-16 MED ORDER — FENTANYL CITRATE 0.05 MG/ML IJ SOLN
INTRAMUSCULAR | Status: AC
  Filled 2013-08-16: qty 2

## 2013-08-16 MED ORDER — MIDAZOLAM HCL 2 MG/2ML IJ SOLN
INTRAMUSCULAR | Status: DC | PRN
  Administered 2013-08-16: 2 mg via INTRAVENOUS

## 2013-08-16 MED ORDER — PHENYLEPHRINE 40 MCG/ML (10ML) SYRINGE FOR IV PUSH (FOR BLOOD PRESSURE SUPPORT)
PREFILLED_SYRINGE | INTRAVENOUS | Status: AC
  Filled 2013-08-16: qty 5

## 2013-08-16 MED ORDER — VASOPRESSIN 20 UNIT/ML IJ SOLN
INTRAMUSCULAR | Status: AC
  Filled 2013-08-16: qty 1

## 2013-08-16 MED ORDER — NEOSTIGMINE METHYLSULFATE 10 MG/10ML IV SOLN
INTRAVENOUS | Status: AC
  Filled 2013-08-16: qty 1

## 2013-08-16 SURGICAL SUPPLY — 55 items
BARRIER ADHS 3X4 INTERCEED (GAUZE/BANDAGES/DRESSINGS) IMPLANT
BRR ADH 4X3 ABS CNTRL BYND (GAUZE/BANDAGES/DRESSINGS)
CANISTER SUCT 3000ML (MISCELLANEOUS) ×4 IMPLANT
CLOSURE WOUND 1/2 X4 (GAUZE/BANDAGES/DRESSINGS)
CLOTH BEACON ORANGE TIMEOUT ST (SAFETY) ×4 IMPLANT
COVER LIGHT HANDLE  1/PK (MISCELLANEOUS) ×2
COVER LIGHT HANDLE 1/PK (MISCELLANEOUS) IMPLANT
DECANTER SPIKE VIAL GLASS SM (MISCELLANEOUS) IMPLANT
DRAPE CESAREAN BIRTH W POUCH (DRAPES) ×4 IMPLANT
DRAPE WARM FLUID 44X44 (DRAPE) IMPLANT
DRSG OPSITE POSTOP 4X10 (GAUZE/BANDAGES/DRESSINGS) ×4 IMPLANT
DRSG TEGADERM 2.38X2.75 (GAUZE/BANDAGES/DRESSINGS) IMPLANT
DRSG XEROFORM 1X8 (GAUZE/BANDAGES/DRESSINGS) ×4 IMPLANT
DURAPREP 26ML APPLICATOR (WOUND CARE) ×4 IMPLANT
GLOVE BIOGEL PI IND STRL 7.0 (GLOVE) ×4 IMPLANT
GLOVE BIOGEL PI IND STRL 8 (GLOVE) ×2 IMPLANT
GLOVE BIOGEL PI INDICATOR 7.0 (GLOVE) ×4
GLOVE BIOGEL PI INDICATOR 8 (GLOVE) ×2
GLOVE ECLIPSE 7.5 STRL STRAW (GLOVE) ×8 IMPLANT
GOWN STRL REUS W/TWL LRG LVL3 (GOWN DISPOSABLE) ×12 IMPLANT
HEMOSTAT SURGICEL 4X8 (HEMOSTASIS) ×2 IMPLANT
MARKER SKIN DUAL TIP RULER LAB (MISCELLANEOUS) ×2 IMPLANT
NDL HYPO 18GX1.5 BLUNT FILL (NEEDLE) IMPLANT
NDL HYPO 25X1 1.5 SAFETY (NEEDLE) IMPLANT
NEEDLE HYPO 18GX1.5 BLUNT FILL (NEEDLE) ×12 IMPLANT
NEEDLE HYPO 25X1 1.5 SAFETY (NEEDLE) ×8 IMPLANT
NS IRRIG 1000ML POUR BTL (IV SOLUTION) ×4 IMPLANT
PACK ABDOMINAL GYN (CUSTOM PROCEDURE TRAY) ×4 IMPLANT
PAD OB MATERNITY 4.3X12.25 (PERSONAL CARE ITEMS) ×4 IMPLANT
PROTECTOR NERVE ULNAR (MISCELLANEOUS) ×4 IMPLANT
RETRACTOR WND ALEXIS 25 LRG (MISCELLANEOUS) IMPLANT
RTRCTR WOUND ALEXIS 25CM LRG (MISCELLANEOUS)
SPONGE GAUZE 4X4 12PLY STER LF (GAUZE/BANDAGES/DRESSINGS) ×8 IMPLANT
SPONGE LAP 18X18 X RAY DECT (DISPOSABLE) ×6 IMPLANT
STAPLER VISISTAT 35W (STAPLE) ×4 IMPLANT
STRIP CLOSURE SKIN 1/2X4 (GAUZE/BANDAGES/DRESSINGS) IMPLANT
SUT CHROMIC 3 0 SH 27 (SUTURE) IMPLANT
SUT VIC AB 0 CT1 18XCR BRD8 (SUTURE) ×6 IMPLANT
SUT VIC AB 0 CT1 36 (SUTURE) ×2 IMPLANT
SUT VIC AB 0 CT1 8-18 (SUTURE) ×12
SUT VIC AB 1 CT1 18XBRD ANBCTR (SUTURE) IMPLANT
SUT VIC AB 1 CT1 8-18 (SUTURE)
SUT VIC AB 3-0 CT1 27 (SUTURE) ×8
SUT VIC AB 3-0 CT1 TAPERPNT 27 (SUTURE) ×4 IMPLANT
SUT VIC AB 3-0 SH 27 (SUTURE) ×4
SUT VIC AB 3-0 SH 27X BRD (SUTURE) ×2 IMPLANT
SUT VICRYL 0 TIES 12 18 (SUTURE) ×4 IMPLANT
SUT VICRYL 3 0 BR 18  UND (SUTURE)
SUT VICRYL 3 0 BR 18 UND (SUTURE) IMPLANT
SYR 20CC LL (SYRINGE) ×4 IMPLANT
SYR 30ML LL (SYRINGE) ×4 IMPLANT
SYR CONTROL 10ML LL (SYRINGE) ×4 IMPLANT
TOWEL OR 17X24 6PK STRL BLUE (TOWEL DISPOSABLE) ×8 IMPLANT
TRAY FOLEY CATH 14FR (SET/KITS/TRAYS/PACK) ×4 IMPLANT
WATER STERILE IRR 1000ML POUR (IV SOLUTION) ×2 IMPLANT

## 2013-08-16 NOTE — Transfer of Care (Signed)
Immediate Anesthesia Transfer of Care Note  Patient: [name]  Procedure(s) Performed: Procedure(s): HYSTERECTOMY ABDOMINAL (N/A) BILATERAL SALPINGECTOMY (Bilateral)  Patient Location: PACU  Anesthesia Type:General  Level of Consciousness: awake, alert  and oriented  Airway & Oxygen Therapy: Patient Spontanous Breathing and Patient connected to nasal cannula oxygen  Post-op Assessment: Report given to PACU RN, Post -op Vital signs reviewed and stable and Patient moving all extremities  Post vital signs: Reviewed and stable  Complications: No apparent anesthesia complications

## 2013-08-16 NOTE — H&P (View-Only) (Signed)
[name] is an 48 y.o. female. Presented to the office today for preop exam. Patient is scheduled for total abdominal hysterectomy with bilateral salpingectomy and ovarian conservation in the next few weeks. Patient has been complaining of lower abdominal discomfort and she has been able to palpate her uterine fibroids transabdominally. She is also complaining dysmenorrhea and menorrhagia.  Patient had an ultrasound today which demonstrated the following: Uterus measures 12 x 11 x 8.6 cm endometrial stripe a 10 mm. Patient had a total of 6 fibroids the largest one measuring 65 a 51 mm. Ovaries appeared to be normal.  Patient with prior tubal sterilization procedure and having normal menstrual cycle. Patient also had endometrial ablation in 2008. Patient has 2 abdominal Pfannenstiel scars from previous cesarean section and a large solid Vicryl scar as well.    Pertinent Gynecological History: Menses: DUB Bleeding: dysfunctional uterine bleeding Contraception: tubal ligation DES exposure: unknown Blood transfusions: none Sexually transmitted diseases: no past history Previous GYN Procedures: c sedt, Cryo of cervix,BTSP, endometrial ablation  Last mammogram: normal Date: 2013 Last pap: normal Date: 2013 OB History: G2, P2   Menstrual History: Menarche age: 52  Patient's last menstrual period was 07/08/2013.    Past Medical History  Diagnosis Date  . Elevated cholesterol   . Fibromyalgia     Past Surgical History  Procedure Laterality Date  . Cesarean section      X 2  . Tubal ligation    . Her option  01/2007  . Endometrial ablation    . Hernia repair    . Gynecologic cryosurgery  1994    Family History  Problem Relation Age of Onset  . Heart disease Father   . Hypertension Maternal Grandmother   . Breast cancer Maternal Grandmother   . Breast cancer Paternal Grandmother     Social History:  reports that she has never smoked. She does not have any smokeless  tobacco history on file. She reports that she drinks alcohol. She reports that she does not use illicit drugs.  Allergies:  Allergies  Allergen Reactions  . Caffeine   . Penicillins   . Tetracyclines & Related   . Yellow Dyes (Non-Tartrazine)     Headaches     (Not in a hospital admission)  REVIEW OF SYSTEMS: A ROS was performed and pertinent positives and negatives are included in the history.  GENERAL: No fevers or chills. HEENT: No change in vision, no earache, sore throat or sinus congestion. NECK: No pain or stiffness. CARDIOVASCULAR: No chest pain or pressure. No palpitations. PULMONARY: No shortness of breath, cough or wheeze. GASTROINTESTINAL: No abdominal pain, nausea, vomiting or diarrhea, melena or bright red blood per rectum. GENITOURINARY: No urinary frequency, urgency, hesitancy or dysuria. MUSCULOSKELETAL: No joint or muscle pain, no back pain, no recent trauma. DERMATOLOGIC: No rash, no itching, no lesions. ENDOCRINE: No polyuria, polydipsia, no heat or cold intolerance. No recent change in weight. HEMATOLOGICAL: No anemia or easy bruising or bleeding. NEUROLOGIC: No headache, seizures, numbness, tingling or weakness. PSYCHIATRIC: No depression, no loss of interest in normal activity or change in sleep pattern.     Blood pressure 102/68, last menstrual period 07/08/2013.  Physical Exam:  HEENT:unremarkable Neck:Supple, midline, no thyroid megaly, no carotid bruits Lungs:  Clear to auscultation no rhonchi's or wheezes Heart:Regular rate and rhythm, no murmurs or gallops Breast Exam:No palpable masses or tenderness  Abdomen:Tender fibroid uterus felt approximately 3 fingerbreadths above the symphysis pubis  Pelvic:BUSwithin normal limits  Vagina:No  lesions or discharge very narrow  Cervix:No lesions or discharge  Uterus:To 14 week size irregular tender uterus  Adnexa:Difficult to evaluate due to size of uterus  Extremities: No cords, no  edema Rectal:Unremarkable   Assessment/Plan: With symptomatic leiomyomatous uteri scheduled for total abdominal hysterectomy with bilateral salpingectomy and ovarian conservation. Patient with history of umbilical hernia repair x2 as well as 2 large Pfannenstiel scar from previous cesarean sections. This along with a narrow vagina we decided to proceed with an abdominal hysterectomy approach. The risk of the procedure were discussed as follows:                        Patient was counseled as to the risk of surgery to include the following:  1. Infection (prohylactic antibiotics will be administered)  2. DVT/Pulmonary Embolism (prophylactic pneumo compression stockings will be used)  3.Trauma to internal organs requiring additional surgical procedure to repair any injury to     Internal organs requiring perhaps additional hospitalization days.  4.Hemmorhage requiring transfusion and blood products which carry risks such as   anaphylactic reaction, hepatitis and AIDS  Patient had received literature information on the procedure scheduled and all her questions were answered and fully accepts all risk.   Elita Quick H FernandezMD1:35 PMTD@Note : This dictation was prepared with  Dragon/digital dictation along withSmart phrase technology. Any transcriptional errors that result from this process are unintentional.      [name] 08/05/2013, 11:36 AM  Note: This dictation was prepared with  Dragon/digital dictation along withSmart phrase technology. Any transcriptional errors that result from this process are unintentional.

## 2013-08-16 NOTE — Op Note (Signed)
Operative Note  08/16/2013  9:32 AM  PATIENT:  [name]  48 y.o. female  PRE-OPERATIVE DIAGNOSIS:  fibroids, dysmenorrhea, menorrhagia, pelvic pain  POST-OPERATIVE DIAGNOSIS:  fibroids, dysmenorrhea, menorrhagia, pelvic pain  PROCEDURE:  Procedure(s): HYSTERECTOMY ABDOMINAL BILATERAL SALPINGECTOMY  SURGEON:  Surgeon(s): [name], MD Anastasio Auerbach, MD  ANESTHESIA:   general  FINDINGS: Patient with 13 week size multilobulated leiomyomatous uteri with normal-appearing fallopian tubes and ovary. Patient with 2 Pfannenstiel abdominal scars from previous cesarean section. Patient was subumbilical scar from previous hernia repair. Abdominal pelvic adhesions were noted.  DESCRIPTION OF OPERATION:The patient was taken to the operating room and was placed in a dorsal supine position. She was then placed under general anesthesia and intubated. A time out was undertaken to correctly identify the patient and procedure to be undertaken. She was then examined under anesthesia with notation of a 14 week sized multilobulated uterus.The procedure was begun by performing a Pfannenstiel skin Incision with the first knife. The incision was extended to the level of the fascia using Bovie coagulation. The fascia was scored bilaterally at the midline. The fascial incision was then extended in a curvilinear fashion using the Mayo scissors. The fascia was dissected from the muscular area below by both sharp and blunt dissection. The muscular area was dissected along the midline by both sharp and blunt dissection. The peritoneum was subsequently entered. The incision was then extended in a vertical fashion using Metzenbaum scissors. Immediate notation was made of a multilobulated fibroid  uterus was then delivered through the incision. The right And left ovary were within normal limits.A self-retaining O'Connor-O'Sullivan retractor was placed into the abdomen. The abdominal contents were packed  in the upper abdomen using 2 clean wet laps. Two large Kelly clamps were placed above the cornual portion of the uterus, and the hysterectomy was begun by transecting the round ligaments, which were subsequently suture ligated with 0 Vicryl suture and tag. Uterovesical peritoneum was subsequently incised with the Metzenbaum scissors, and incision was extended to the sidewall on both side allowing dissection to the sidewall and subsequent visualization of the uterus. Once the uterus was felt free, the infundibulopelvic ligaments were then grasped with 2 curved Haney clamps, subsequently cut and the pedicle was initially free tied with 0 Vicryl and subsequently suture ligated with 0 Vicryl suture leaving both tubes and ovaries behind. Attention was then turned to the round ligament, which was also secured in the previous fashion. The uterine vessels were subsequently skeletonized, subsequently grasped with curved Heaney clamps, cut and suture ligated with 0 Vicryl suture. Cardinal ligaments on either side were subsequently grasped, cut and suture ligated with 0 Vicryl suture to the level of the uterosacral ligaments, which were then grasped with curved Haney clamps, cut and suture ligated with 0 Vicryl suture. These sutures were left long and tagged with hemostats. Subsequently, 2 large clamps were placed about the upper vaginal cuff. The specimen was subsequently dissected free with the Jorgenson scissors, and the pedicles were suture ligated with 0 Vicryl suture. The vaginal cuff was also closed with 0 Vicryl suture in an interrupted fashion. Bilateral salpingectomies were undertaken by grasping the fallopian tube with a Babcock clamp and placing a Kelly clamp at the mesosalpinx and transecting the tube off the mesosalpinx and suture ligating the pedicle with 3-0 Vicryl suture. Subsequently, the pelvis was irrigated until clear. Points of bleeding were secured either by Bovie coagulation or by suture ligature with a  3-0 Vicryl suture. Surgicel  was placed at the vaginal cuff for additional hemostasis as well as on the pedicles. Once hemostasis was achieved, the O'Connor-O'Sullivan retractor was subsequently removed. Laps were subsequently removed and the abdominal wall was closed in the following fashion. The fascial layers were approximated with 0 Vicryl with sutures coming from the closing angles interlocking every third towards the midline. The subcutaneous layer was reapproximated with 3-0 Vicryl suture. The skin edges were reapproximated with staples. For postoperative analgesia Exparel 1.3% was infiltrated at the level of the peritoneum, fascia and subcutaneous for a total of 40 cc.The patient tolerated the procedure well. The uterus and cervix were passed off the operative field for histological evaluation. The patient was extubated and transferred to recovery room stable vital signs. Sponge count and needle count were correct.     ESTIMATED BLOOD LOSS: 300 cc  Intake/Output Summary (Last 24 hours) at 08/16/13 0932 Last data filed at 08/16/13 0900  Gross per 24 hour  Intake   1900 ml  Output    310 ml  Net   1590 ml     BLOOD ADMINISTERED:none   LOCAL MEDICATIONS USED:  Exparel 1.3%  SPECIMEN:  Source of Specimen:  Uterus, cervix, bilateral fallopian tubes  DISPOSITION OF SPECIMEN:  PATHOLOGY  COUNTS:  YES  PLAN OF CARE: Transfer to PACU  Starlyn Skeans FernandezMD9:32 AMTD@  Note: This dictation was prepared with  Dragon/digital dictation along withSmart phrase technology. Any transcriptional errors that result from this process are unintentional.

## 2013-08-16 NOTE — Anesthesia Postprocedure Evaluation (Signed)
Anesthesia Post Note  Patient: [name]  Procedure(s) Performed: Procedure(s): HYSTERECTOMY ABDOMINAL (N/A) BILATERAL SALPINGECTOMY (Bilateral)  Anesthesia type: General  Patient location: [facility]  Post pain: Pain level controlled  Post assessment: Post-op Vital signs reviewed  Last Vitals: BP 109/63  Pulse 77  Temp(Src) 36.5 C (Oral)  Resp 14  Ht 5\' 2"  (1.575 m)  Wt 141 lb (63.957 kg)  BMI 25.78 kg/m2  SpO2 95%  Post vital signs: Reviewed  Level of consciousness: awake  Complications: No apparent anesthesia complications

## 2013-08-16 NOTE — Addendum Note (Signed)
Addendum created 08/16/13 1429 by Flossie Dibble, CRNA   Modules edited: Notes Section   Notes Section:  File: [id number]

## 2013-08-16 NOTE — Interval H&P Note (Signed)
History and Physical Interval Note:  08/16/2013 7:05 AM  [name]  has presented today for surgery, with the diagnosis of fibroids  The various methods of treatment have been discussed with the patient and family. After consideration of risks, benefits and other options for treatment, the patient has consented to  Procedure(s): HYSTERECTOMY ABDOMINAL (N/A) as a surgical intervention .  The patient's history has been reviewed, patient examined, no change in status, stable for surgery.  I have reviewed the patient's chart and labs.  Questions were answered to the patient's satisfaction.     [name]

## 2013-08-16 NOTE — Anesthesia Postprocedure Evaluation (Signed)
Anesthesia Post Note  Patient: [name]  Procedure(s) Performed: Procedure(s) (LRB): HYSTERECTOMY ABDOMINAL (N/A) BILATERAL SALPINGECTOMY (Bilateral)  Anesthesia type: General  Patient location: PACU  Post pain: Pain level controlled  Post assessment: Post-op Vital signs reviewed  Last Vitals:  Filed Vitals:   08/16/13 0945  BP: 98/52  Pulse: 64  Temp:   Resp: 21    Post vital signs: Reviewed  Level of consciousness: sedated  Complications: No apparent anesthesia complications

## 2013-08-16 NOTE — Anesthesia Preprocedure Evaluation (Addendum)
Anesthesia Evaluation  Patient identified by MRN, date of birth, ID band Patient awake    Reviewed: Allergy & Precautions, H&P , NPO status , Patient's Chart, lab work & pertinent test results, reviewed documented beta blocker date and time   History of Anesthesia Complications (+) PROLONGED EMERGENCEHistory of anesthetic complications: slow to wake up with all anesthetics.  Airway Mallampati: II TM Distance: >3 FB Neck ROM: full    Dental  (+) Teeth Intact   Pulmonary neg pulmonary ROS,  breath sounds clear to auscultation  Pulmonary exam normal       Cardiovascular Exercise Tolerance: Good Rhythm:regular Rate:Normal     Neuro/Psych  Headaches (atypical migraines - last was 3 months ago, diet-controlled), Anxiety    GI/Hepatic Neg liver ROS, GERD-  ,  Endo/Other  negative endocrine ROS  Renal/GU negative Renal ROS  Female GU complaint     Musculoskeletal  (+) Fibromyalgia -  Abdominal   Peds  Hematology negative hematology ROS (+)   Anesthesia Other Findings   Reproductive/Obstetrics negative OB ROS                          Anesthesia Physical Anesthesia Plan  ASA: II  Anesthesia Plan: General ETT   Post-op Pain Management:    Induction:   Airway Management Planned:   Additional Equipment:   Intra-op Plan:   Post-operative Plan:   Informed Consent: I have reviewed the patients History and Physical, chart, labs and discussed the procedure including the risks, benefits and alternatives for the proposed anesthesia with the patient or authorized representative who has indicated his/her understanding and acceptance.   Dental Advisory Given  Plan Discussed with: CRNA and Surgeon  Anesthesia Plan Comments:        Anesthesia Quick Evaluation

## 2013-08-17 ENCOUNTER — Encounter (HOSPITAL_COMMUNITY): Payer: Self-pay | Admitting: Gynecology

## 2013-08-17 LAB — CBC
HCT: 31.6 % — ABNORMAL LOW (ref 36.0–46.0)
Hemoglobin: 10.6 g/dL — ABNORMAL LOW (ref 12.0–15.0)
MCH: 29 pg (ref 26.0–34.0)
MCHC: 33.5 g/dL (ref 30.0–36.0)
MCV: 86.6 fL (ref 78.0–100.0)
PLATELETS: 176 10*3/uL (ref 150–400)
RBC: 3.65 MIL/uL — AB (ref 3.87–5.11)
RDW: 13.4 % (ref 11.5–15.5)
WBC: 9.4 10*3/uL (ref 4.0–10.5)

## 2013-08-17 NOTE — Progress Notes (Signed)
Morphine PCA discontinued per protocol.  62ml Morphine waster in sink with second RN witness, Skeet Latch.  Will continue to monitor patient.  Leighton Roach, RN--------------

## 2013-08-17 NOTE — Progress Notes (Signed)
1 Day Post-Op Procedure(s) (LRB): HYSTERECTOMY ABDOMINAL (N/A) BILATERAL SALPINGECTOMY (Bilateral)  Subjective: Patient reports tolerating PO.    Objective: I have reviewed patient's vital signs, intake and output, medications and labs.  General: alert and cooperative Resp: clear to auscultation bilaterally Cardio: regular rate and rhythm, S1, S2 normal, no murmur, click, rub or gallop GI: soft, non-tender; bowel sounds normal; no masses,  no organomegaly Extremities: extremities normal, atraumatic, no cyanosis or edema Vaginal Bleeding: none  Assessment: s/p Procedure(s): HYSTERECTOMY ABDOMINAL (N/A) BILATERAL SALPINGECTOMY (Bilateral): stable, progressing well and tolerating diet  Plan: Advance diet Encourage ambulation Advance to PO medication Reevaluate tonight for possible discharge  LOS: 1 day    [name] 08/17/2013, 6:53 AM

## 2013-08-17 NOTE — Progress Notes (Signed)
Ur chart review completed.  

## 2013-08-18 NOTE — Discharge Summary (Signed)
Physician Discharge Summary  Patient ID: [name] MRN: [id number] DOB/AGE: 20-Sep-1965 48 y.o.  Admit date: 08/16/2013 Discharge date: 08/18/2013  Admission Diagnoses: Symptomatic leiomyomatous uteri  Discharge Diagnoses: Symptomatic leiomyomatous uteri Active Problems:   Postoperative state   Discharged Condition: good  Hospital Course: Patient was admitted to the hospital on 08/17/2010 she underwent a total abdominal hysterectomy with bilateral salpingectomy secondary to symptomatic leiomyomatous uteri. Patient had a Foley catheter for the first night with adequate urinary output. She was on a PCA pump and both were discontinued at 0500 hours before and after surgery. She maintained stable vital signs and remained afebrile and her diet was advanced to a regular diet and had passed some minimal gas and had showered and was treated be discharged home. Patient returned to the office in a few days to have her staples removed.  Consults: None  Significant Diagnostic Studies: labs: Postoperative hemoglobin 10.6 hematocrit 31.6  Treatments: surgery: Abdominal hysterectomy with bilateral salpingectomy  Discharge Exam: Blood pressure 89/45, pulse 73, temperature 97.7 F (36.5 C), temperature source Oral, resp. rate 16, height 5\' 2"  (1.575 m), weight 141 lb (63.957 kg), SpO2 100.00%. General appearance: alert Heart: Regular rate and rhythm or gallops Lungs: Clear to auscultation is wheezes Abdomen: Soft nontender no rebound or guarding positive bowel sounds incision site intact Extremities: No cords no edema Vaginal pad no bleeding  Disposition: 01-Home or Self Care  Discharge Instructions   CAROTID Sugery: Call MD for difficulty swallowing or speaking; weakness in arms or legs that is a new symtom; severe headache.  If you have increased swelling in the neck and/or  are having difficulty breathing, CALL 911    Complete by:  As directed      Call MD for:  redness, tenderness, or  signs of infection (pain, swelling, bleeding, redness, odor or green/yellow discharge around incision site)    Complete by:  As directed      Call MD for:  severe or increased pain, loss or decreased feeling  in affected limb(s)    Complete by:  As directed      Call MD for:  temperature >100.5    Complete by:  As directed      Discharge instructions    Complete by:  As directed   Appointment to remove staples Monday 10:20 Postop appointment May 22 at 3:30 Hysterectomy, Abdominal & Vaginal Care After Refer to this sheet in the next few weeks. These discharge instructions provide you with general information on caring for yourself after you leave the hospital. Your caregiver may also give you specific instructions. Your treatment has been planned according to the most current medical practices available, but unavoidable complications sometimes occur. If you have any problems or questions after discharge, please call your caregiver. HOME CARE INSTRUCTIONS Healing will take time. You will have tenderness at the surgery site. There may be some swelling and bruising around this area if you had an abdominal hysterectomy. Have an adult stay with you the first 48 to 72 hours after surgery, and then for 1 to 2 weeks afterward to help with daily activities. Only take over-the-counter or prescription medicines for pain, discomfort, or fever as directed by your caregiver.  Do not take aspirin. It can cause bleeding.  Do not drive when taking pain medicine.  It will be normal to be sore for a couple weeks after surgery. See your caregiver if this seems to be getting worse rather than better.  Follow your caregiver's advice regarding  diet, exercise, lifting, driving, and general activities.  Take showers instead of baths for a few weeks as directed.  You may resume your usual diet as directed.  Get plenty of rest and sleep.  Do not douche, use tampons, or have sexual intercourse until your caregiver says it  is okay.  Change your bandages (dressings) as directed if you had an abdominal hysterectomy.  Take your temperature twice a day and write it down.  Do not drink alcohol until your caregiver says it is okay.  If you develop constipation, you may take a mild laxative with your caregiver's permission. Eating bran foods helps with constipation problems. Drink enough water and fluids to keep your urine clear or pale yellow.  Do not sign any legal documents until you feel normal again.  Keep all of your follow-up appointments.  Make sure you and your family understand everything about your operation and recovery.  SEEK MEDICAL CARE IF: There is swelling, redness, or increasing pain in the wound area.  Fluid (pus) is coming from the wound.  You notice a bad smell coming from the wound or surgical dressing.  You have pain, redness, and swelling from the intravenous (IV) site.  The wound breaks open.  You feel dizzy.  You develop pain or bleeding when you urinate.  You develop diarrhea.  You feel sick to your stomach (nauseous) and throw up (vomit).  You develop abnormal vaginal discharge.  You develop a rash.  You have any type of abnormal reaction or develop an allergy to your medicine.  Your pain medicine is not relieving your pain.  seek immediate medical care if: You have an oral temperature above 100, not controlled by medicine. HYYou develop chest pain.  You develop shortness of breath.  You pass out (faint).  You develop pain, swelling, or redness of your leg.  You develop heavy vaginal bleeding, with or without blood clots.  MAKE SURE YOU: Understand these instructions.  Will watch your condition.  Will get help right away if you are not doing well or get worse.  Document Released: 05/24/2003 Document Re-Released: 08/21/2009 [facility] Patient Information 2011 [place].   Starlyn Skeans FernandezMD3:38 PMTD@     Driving Restrictions    Complete by:  As directed   No driving for 1  weeks     Lifting restrictions    Complete by:  As directed   No lifting for 6 weeks     Resume previous diet    Complete by:  As directed             Medication List         meloxicam 15 MG tablet  Commonly known as:  MOBIC  Take 15 mg by mouth daily as needed for pain (for fibromyalgia).     PARoxetine 10 MG tablet  Commonly known as:  PAXIL  Take 5 mg by mouth daily.         Signed: [name] 08/18/2013, 9:07 AM

## 2013-08-22 ENCOUNTER — Ambulatory Visit (INDEPENDENT_AMBULATORY_CARE_PROVIDER_SITE_OTHER): Payer: BC Managed Care – PPO | Admitting: Gynecology

## 2013-08-22 ENCOUNTER — Encounter: Payer: Self-pay | Admitting: Gynecology

## 2013-08-22 VITALS — BP 124/78

## 2013-08-22 DIAGNOSIS — Z4802 Encounter for removal of sutures: Secondary | ICD-10-CM

## 2013-08-22 DIAGNOSIS — R21 Rash and other nonspecific skin eruption: Secondary | ICD-10-CM

## 2013-08-22 MED ORDER — PREDNISONE (PAK) 10 MG PO TABS: ORAL_TABLET | Freq: Every day | ORAL | Status: DC

## 2013-08-22 NOTE — Progress Notes (Signed)
   Patient presented to the office today to have her staples removed. On 08/16/2013 patient underwent abdominal hysterectomy with bilateral salpingectomies a result of symptomatic leiomyomatous uteri consisting of dysmenorrhea, menorrhagia and pelvic pain. Patient's been doing well otherwise with the exception that she developed a rash which she thinks it was a result of the morphine that she received to her PCA pump in the hospital. She's not taking her by mouth narcotic medication. She had some difficulty having bowel movements she had one today.  Exam: Patient with slight rash on her anterior abdomen and a few areas on her upper extremities.  Pfannenstiel incision intact Staples removed incision was  Steri-Stripped  The following pathology report was discussed with the patient Diagnosis Uterus and bilateral fallopian tubes, with cervix - CERVIX: SQUAMOUS METAPLASIA AND NABOTHIAN CYST. - ENDOMETRIUM: PROLIFERATIVE. NO EVIDENCE OF HYPERPLASIA OR CARCINOMA. - MYOMETRIUM: LEIOMYOMATA. - UTERINE SEROSA: FIBROUS ADHESIONS. NO ENDOMETRIOSIS OR MALIGNANCY. - BILATERAL FALLOPIAN TUBES: UNREMARKABLE. NO ENDOMETRIOSIS OR MALIGNANCY.  Assessment/plan: Questionable allergic reaction to PCA pump morphine? Patient will be placed on a steroid pack per week. She'll also take Benadryl 25 mg by mouth when necessary 12 times a day. She will apply calamine organ prolapse and 2 her skin as needed. She'll return to the office in 2 weeks for her first complaint postop visit.

## 2013-09-05 ENCOUNTER — Ambulatory Visit (INDEPENDENT_AMBULATORY_CARE_PROVIDER_SITE_OTHER): Payer: BC Managed Care – PPO | Admitting: Gynecology

## 2013-09-05 ENCOUNTER — Encounter: Payer: Self-pay | Admitting: Gynecology

## 2013-09-05 VITALS — BP 120/82

## 2013-09-05 DIAGNOSIS — Z09 Encounter for follow-up examination after completed treatment for conditions other than malignant neoplasm: Secondary | ICD-10-CM

## 2013-09-05 MED ORDER — METRONIDAZOLE 0.75 % VA GEL: 1.0000 | Freq: Every day | VAGINAL | Status: DC

## 2013-09-05 NOTE — Progress Notes (Signed)
   Patient presented to the office today for a three-week postop appointment.On 08/16/2013 patient underwent abdominal hysterectomy with bilateral salpingectomies a result of symptomatic leiomyomatous uteri consisting of dysmenorrhea, menorrhagia and pelvic pain. Patient is asymptomatic and doing well today. Pathology report was once again discussed as follows:   The following pathology report was discussed with the patient  Diagnosis  Uterus and bilateral fallopian tubes, with cervix  - CERVIX: SQUAMOUS METAPLASIA AND NABOTHIAN CYST.  - ENDOMETRIUM: PROLIFERATIVE. NO EVIDENCE OF HYPERPLASIA OR CARCINOMA.  - MYOMETRIUM: LEIOMYOMATA.  - UTERINE SEROSA: FIBROUS ADHESIONS. NO ENDOMETRIOSIS OR MALIGNANCY.  - BILATERAL FALLOPIAN TUBES: UNREMARKABLE. NO ENDOMETRIOSIS OR MALIGNANCY.  Exam: Abdomen: Soft nontender no rebound or guarding Pfannenstiel incision intact Pelvic: The urethra Skene was within normal limits Vagina: Vaginal cuff intact slight blood-tinged at the vaginal cuff silver nitrate applied Bimanual exam: No palpable mass or tenderness Rectal exam not done  Assessment/plan: Patient 3 weeks status post abdominal hysterectomy with bilateral salpingectomy for symptomatically leiomyomata uteri doing well. She will be prescribed MetroGel vaginal cream to apply each bedtime for 7-to 10 days. She'll return back to the office in 3 weeks for final postop visit.

## 2013-09-27 ENCOUNTER — Encounter: Payer: Self-pay | Admitting: Gynecology

## 2013-09-27 ENCOUNTER — Ambulatory Visit (INDEPENDENT_AMBULATORY_CARE_PROVIDER_SITE_OTHER): Payer: BC Managed Care – PPO | Admitting: Gynecology

## 2013-09-27 VITALS — BP 116/72

## 2013-09-27 DIAGNOSIS — Z09 Encounter for follow-up examination after completed treatment for conditions other than malignant neoplasm: Secondary | ICD-10-CM

## 2013-09-27 NOTE — Progress Notes (Signed)
   Patient presented to the office today for her final six-week postop visit.On 08/16/2013 patient underwent abdominal hysterectomy with bilateral salpingectomies a result of symptomatic leiomyomatous uteri consisting of dysmenorrhea, menorrhagia and pelvic pain. Patient is asymptomatic and doing well today. Pathology report was once again discussed as follows:  The following pathology report was discussed with the patient  Diagnosis  Uterus and bilateral fallopian tubes, with cervix  - CERVIX: SQUAMOUS METAPLASIA AND NABOTHIAN CYST.  - ENDOMETRIUM: PROLIFERATIVE. NO EVIDENCE OF HYPERPLASIA OR CARCINOMA.  - MYOMETRIUM: LEIOMYOMATA.  - UTERINE SEROSA: FIBROUS ADHESIONS. NO ENDOMETRIOSIS OR MALIGNANCY.  - BILATERAL FALLOPIAN TUBES: UNREMARKABLE. NO ENDOMETRIOSIS OR MALIGNANCY  Exam: Abdomen soft nontender no rebound or guarding Pfannenstiel incision intact Pelvic: Bartholin urethra Skene glands within normal limits Vaginal cuff intact no lesions noted Bimanual exam no palpable mass or tenderness Rectal exam not done  Assessment/plan: Patient 6 weeks status post abdominal hysterectomy with bilateral salpingectomy as a result of symptomatic leiomyomatous uteri and dysmenorrhea and menorrhagia and pelvic pain. The patient is doing well. She may resume full normal activity and we will see her back in one year or when necessary. A requisition was given person she was overdue for mammogram.

## 2013-10-20 ENCOUNTER — Other Ambulatory Visit: Payer: Self-pay

## 2013-10-20 ENCOUNTER — Other Ambulatory Visit: Payer: Self-pay | Admitting: Gynecology

## 2013-10-20 DIAGNOSIS — Z1231 Encounter for screening mammogram for malignant neoplasm of breast: Secondary | ICD-10-CM

## 2013-10-25 ENCOUNTER — Ambulatory Visit (HOSPITAL_COMMUNITY)
Admission: RE | Admit: 2013-10-25 | Discharge: 2013-10-25 | Disposition: A | Discharge status: Home / Self Care | Payer: BC Managed Care – PPO | Source: Ambulatory Visit | Attending: Gynecology | Admitting: Gynecology

## 2013-10-25 DIAGNOSIS — Z1231 Encounter for screening mammogram for malignant neoplasm of breast: Secondary | ICD-10-CM | POA: Insufficient documentation

## 2014-01-16 ENCOUNTER — Encounter: Payer: Self-pay | Admitting: Gynecology

## 2015-07-26 ENCOUNTER — Ambulatory Visit: Payer: Self-pay

## 2015-07-26 ENCOUNTER — Other Ambulatory Visit: Payer: Self-pay | Admitting: Occupational Medicine

## 2015-07-26 DIAGNOSIS — M25572 Pain in left ankle and joints of left foot: Secondary | ICD-10-CM

## 2015-07-26 DIAGNOSIS — M79672 Pain in left foot: Secondary | ICD-10-CM

## 2015-07-26 DIAGNOSIS — M79662 Pain in left lower leg: Secondary | ICD-10-CM

## 2015-08-08 ENCOUNTER — Ambulatory Visit: Payer: Self-pay

## 2015-08-08 ENCOUNTER — Other Ambulatory Visit: Payer: Self-pay | Admitting: Occupational Medicine

## 2015-08-08 DIAGNOSIS — M25572 Pain in left ankle and joints of left foot: Secondary | ICD-10-CM

## 2016-07-30 ENCOUNTER — Encounter: Payer: Self-pay | Admitting: Gynecology

## 2017-10-29 ENCOUNTER — Other Ambulatory Visit: Payer: Self-pay | Admitting: Otolaryngology

## 2017-10-29 DIAGNOSIS — E041 Nontoxic single thyroid nodule: Secondary | ICD-10-CM

## 2017-11-06 ENCOUNTER — Ambulatory Visit
Admission: RE | Admit: 2017-11-06 | Discharge: 2017-11-06 | Disposition: A | Discharge status: Home / Self Care | Payer: BC Managed Care – PPO | Source: Ambulatory Visit | Attending: Otolaryngology | Admitting: Otolaryngology

## 2017-11-06 DIAGNOSIS — E041 Nontoxic single thyroid nodule: Secondary | ICD-10-CM

## 2017-11-19 ENCOUNTER — Other Ambulatory Visit: Payer: Self-pay | Admitting: Otolaryngology

## 2017-11-19 DIAGNOSIS — E041 Nontoxic single thyroid nodule: Secondary | ICD-10-CM

## 2017-12-03 ENCOUNTER — Ambulatory Visit
Admission: RE | Admit: 2017-12-03 | Discharge: 2017-12-03 | Disposition: A | Discharge status: Home / Self Care | Payer: BC Managed Care – PPO | Source: Ambulatory Visit | Attending: Otolaryngology | Admitting: Otolaryngology

## 2017-12-03 ENCOUNTER — Other Ambulatory Visit (HOSPITAL_COMMUNITY)
Admission: RE | Admit: 2017-12-03 | Discharge: 2017-12-03 | Disposition: A | Discharge status: Home / Self Care | Payer: BC Managed Care – PPO | Source: Ambulatory Visit | Attending: Physician Assistant | Admitting: Physician Assistant

## 2017-12-03 DIAGNOSIS — E041 Nontoxic single thyroid nodule: Secondary | ICD-10-CM

## 2017-12-09 ENCOUNTER — Other Ambulatory Visit (HOSPITAL_COMMUNITY): Payer: Self-pay | Admitting: Otolaryngology

## 2017-12-09 DIAGNOSIS — E0789 Other specified disorders of thyroid: Secondary | ICD-10-CM

## 2017-12-09 DIAGNOSIS — E041 Nontoxic single thyroid nodule: Secondary | ICD-10-CM

## 2017-12-14 ENCOUNTER — Ambulatory Visit (HOSPITAL_COMMUNITY)
Admission: RE | Admit: 2017-12-14 | Discharge: 2017-12-14 | Disposition: A | Discharge status: Home / Self Care | Payer: BC Managed Care – PPO | Source: Ambulatory Visit | Attending: Otolaryngology | Admitting: Otolaryngology

## 2017-12-14 DIAGNOSIS — E0789 Other specified disorders of thyroid: Secondary | ICD-10-CM | POA: Insufficient documentation

## 2017-12-14 MED ORDER — SODIUM CHLORIDE 0.9 % IJ SOLN
INTRAMUSCULAR | Status: AC
  Filled 2017-12-14: qty 50

## 2017-12-14 MED ORDER — IOHEXOL 300 MG/ML  SOLN
75.0000 mL | Freq: Once | INTRAMUSCULAR | Status: AC | PRN
  Administered 2017-12-14: 75 mL via INTRAVENOUS

## 2017-12-23 ENCOUNTER — Ambulatory Visit: Payer: Self-pay | Admitting: Surgery

## 2018-01-08 ENCOUNTER — Encounter (HOSPITAL_COMMUNITY): Payer: Self-pay

## 2018-01-08 NOTE — Pre-Procedure Instructions (Signed)
Dasja Brase Sova  01/08/2018      [id number] [place], [place] - [address] [place] [address] Phone: [phone] Fax: [phone]    Your procedure is scheduled on Monday November 4th.  Report to [facility] Admitting at 7:30 A.M.  Call this number if you have problems the morning of surgery:  [phone]   Remember:  Do not eat or drink after midnight.  Take these medicines the morning of surgery with A SIP OF WATER  PARoxetine (PAXIL)  SUMAtriptan (IMITREX) if needed sodium chloride (OCEAN) 0.65 % SOLN nasal spray if needed  7 days prior to surgery STOP taking any Aspirin(unless otherwise instructed by your surgeon), Mobic (meloxicam), Aleve, Naproxen, Ibuprofen, Motrin, Advil, Goody's, BC's, all herbal medications, fish oil, and all vitamins     Do not wear jewelry, make-up or nail polish.  Do not wear lotions, powders, or perfumes, or deodorant.  Do not shave 48 hours prior to surgery.   Do not bring valuables to the hospital.  [facility] is not responsible for any belongings or valuables.  Contacts, eyeglasses, hearing aids, dentures or bridgework may not be worn into surgery.  Leave your suitcase in the car.  After surgery it may be brought to your room.  For patients admitted to the hospital, discharge time will be determined by your treatment team.  Patients discharged the day of surgery will not be allowed to drive home.  [facility]- Preparing For Surgery  Before surgery, you can play an important role. Because skin is not sterile, your skin needs to be as free of germs as possible. You can reduce the number of germs on your skin by washing with CHG (chlorahexidine gluconate) Soap before surgery.  CHG is an antiseptic cleaner which kills germs and bonds with the skin to continue killing germs even after washing.    Oral Hygiene is also important to reduce your risk of infection.   Remember - BRUSH YOUR TEETH THE MORNING OF SURGERY WITH YOUR REGULAR TOOTHPASTE  Please do not use if you have an allergy to CHG or antibacterial soaps. If your skin becomes reddened/irritated stop using the CHG.  Do not shave (including legs and underarms) for at least 48 hours prior to first CHG shower. It is OK to shave your face.  Please follow these instructions carefully.   1. Shower the NIGHT BEFORE SURGERY and the MORNING OF SURGERY with CHG.   2. If you chose to wash your hair, wash your hair first as usual with your normal shampoo.  3. After you shampoo, rinse your hair and body thoroughly to remove the shampoo.  4. Use CHG as you would any other liquid soap. You can apply CHG directly to the skin and wash gently with a scrungie or a clean washcloth.   5. Apply the CHG Soap to your body ONLY FROM THE NECK DOWN.  Do not use on open wounds or open sores. Avoid contact with your eyes, ears, mouth and genitals (private parts). Wash Face and genitals (private parts)  with your normal soap.  6. Wash thoroughly, paying special attention to the area where your surgery will be performed.  7. Thoroughly rinse your body with warm water from the neck down.  8. DO NOT shower/wash with your normal soap after using and rinsing off the CHG Soap.  9. Pat yourself dry with a CLEAN TOWEL.  10. Wear CLEAN  PAJAMAS to bed the night before surgery, wear comfortable clothes the morning of surgery  11. Place CLEAN SHEETS on your bed the night of your first shower and DO NOT SLEEP WITH PETS.    Day of Surgery: Shower as stated above. Do not apply any deodorants/lotions.  Please wear clean clothes to the hospital/surgery center.   Remember to brush your teeth WITH YOUR REGULAR TOOTHPASTE.   Please read over the following fact sheets that you were given.

## 2018-01-11 ENCOUNTER — Encounter (HOSPITAL_COMMUNITY)
Admission: RE | Admit: 2018-01-11 | Discharge: 2018-01-11 | Disposition: A | Discharge status: Home / Self Care | Payer: BC Managed Care – PPO | Source: Ambulatory Visit | Attending: Surgery | Admitting: Surgery

## 2018-01-11 ENCOUNTER — Other Ambulatory Visit: Payer: Self-pay

## 2018-01-11 ENCOUNTER — Ambulatory Visit (HOSPITAL_COMMUNITY)
Admission: RE | Admit: 2018-01-11 | Discharge: 2018-01-11 | Disposition: A | Discharge status: Home / Self Care | Payer: BC Managed Care – PPO | Source: Ambulatory Visit | Attending: Anesthesiology | Admitting: Anesthesiology

## 2018-01-11 ENCOUNTER — Encounter (HOSPITAL_COMMUNITY): Payer: Self-pay

## 2018-01-11 DIAGNOSIS — Z9009 Acquired absence of other part of head and neck: Secondary | ICD-10-CM

## 2018-01-11 DIAGNOSIS — E890 Postprocedural hypothyroidism: Secondary | ICD-10-CM

## 2018-01-11 HISTORY — DX: Malignant (primary) neoplasm, unspecified: C80.1

## 2018-01-11 LAB — CBC
HCT: 40.8 % (ref 36.0–46.0)
Hemoglobin: 12.7 g/dL (ref 12.0–15.0)
MCH: 27.7 pg (ref 26.0–34.0)
MCHC: 31.1 g/dL (ref 30.0–36.0)
MCV: 89.1 fL (ref 80.0–100.0)
NRBC: 0 % (ref 0.0–0.2)
Platelets: 221 10*3/uL (ref 150–400)
RBC: 4.58 MIL/uL (ref 3.87–5.11)
RDW: 13 % (ref 11.5–15.5)
WBC: 5.3 10*3/uL (ref 4.0–10.5)

## 2018-01-11 LAB — BASIC METABOLIC PANEL
ANION GAP: 9 (ref 5–15)
BUN: 16 mg/dL (ref 6–20)
CALCIUM: 9.1 mg/dL (ref 8.9–10.3)
CO2: 24 mmol/L (ref 22–32)
Chloride: 106 mmol/L (ref 98–111)
Creatinine, Ser: 0.74 mg/dL (ref 0.44–1.00)
GLUCOSE: 97 mg/dL (ref 70–99)
Potassium: 4.1 mmol/L (ref 3.5–5.1)
SODIUM: 139 mmol/L (ref 135–145)

## 2018-01-11 NOTE — Progress Notes (Signed)
PCP: [place]  Cardiologist: denies  DM: denies  Stress Test: >10 yrs  Pt denies SOB, cough, fever, chest pain.  Pt states understanding of instructions given for day of surgery.  Pt allergic to dye in surgical scrub. Not given, will need to wipe down day of surgery.

## 2018-01-17 ENCOUNTER — Encounter (HOSPITAL_COMMUNITY): Payer: Self-pay | Admitting: Surgery

## 2018-01-17 DIAGNOSIS — C73 Malignant neoplasm of thyroid gland: Secondary | ICD-10-CM | POA: Diagnosis present

## 2018-01-17 NOTE — H&P (Signed)
General Surgery [facility]  [name] DOB: [birth year] Married / Language: English / Race: White Female   History of Present Illness  The patient is a 52 year old female who presents with thyroid cancer.  CHIEF COMPLAINT: papillary thyroid carcinoma  Patient is referred by Dr. [name] for surgical evaluation and management of papillary thyroid carcinoma. Patient's primary care physician is Dr. [name] at [facility] at [place]. 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 On self-examination she noted a nodule in the anterior right neck. This persisted and became slightly larger. She presented to Dr. [name] for evaluation. Patient underwent a thyroid ultrasound on November 06, 2017. This showed a normal size thyroid gland with a solitary nodule in the isthmus and anterior right thyroid lobe measuring 1.8 x 1.7 x 1.0 cm. It was felt to be highly suspicious and biopsy was recommended. There appeared to be extra thyroidal extension into the overlying strap muscle. A 6 mm hypoechoic nodule was also noted just inferior to the left thyroid lobe, perhaps representing a metastatic lymph node. Biopsy confirmed papillary thyroid carcinoma. Patient subsequently underwent a CT scan of the neck which demonstrated the mass but no evidence of lymphadenopathy. Again there appears to be extra thyroidal extension into the overlying strap muscle. Patient has never been on thyroid medication. She has had no prior thyroid problems. She has had no prior head or neck surgery. There is no family history of thyroid disease or thyroid cancer. Patient denies tremors or palpitations. She presents today to discuss surgical management.   Past Surgical History Cesarean Section - Multiple  Hysterectomy (not due to cancer) - Partial  Ventral / Umbilical Hernia  Surgery  Left.  Diagnostic Studies History Colonoscopy  never Mammogram  1-3 years ago  Allergies  Penicillins  Rash. Fever Tetracyclines & Related  Nausea, Vomiting. Zithromax *MACROLIDES*  Rash. Yellow Dyes  Atypical Migraines Allergies Reconciled   Medication History PARoxetine HCl (10MG  Tablet, Oral) Active. Medications Reconciled  Social History Alcohol use  Occasional alcohol use. No caffeine use  No drug use  Tobacco use  Never smoker.  Family History  Cancer  Father. Heart Disease  Father. Ischemic Bowel Disease  Mother. Respiratory Condition  Father.  Pregnancy / Birth History Age at menarche  86 years. Gravida  2 Length (months) of breastfeeding  3-6 Maternal age  61-30 Para  2  Other Problems  Gastroesophageal Reflux Disease  Hemorrhoids  Hypercholesterolemia  Migraine Headache  Thyroid Cancer  Thyroid Disease  Umbilical Hernia Repair   Review of Systems Skin Not Present- Change in Wart/Mole, Dryness, Hives, Jaundice, New Lesions, Non-Healing Wounds, Rash and Ulcer. HEENT Present- Wears glasses/contact lenses. Not Present- Earache, Hearing Loss, Hoarseness, Nose Bleed, Oral Ulcers, Ringing in the Ears, Seasonal Allergies, Sinus Pain, Sore Throat, Visual Disturbances and Yellow Eyes. Cardiovascular Not Present- Chest Pain, Difficulty Breathing Lying Down, Leg Cramps, Palpitations, Rapid Heart Rate, Shortness of Breath and Swelling of Extremities. Gastrointestinal Present- Constipation. Not Present- Abdominal Pain, Bloating, Bloody Stool, Change in Bowel Habits, Chronic diarrhea, Difficulty Swallowing, Excessive gas, Gets full quickly at meals, Hemorrhoids, Indigestion, Nausea, Rectal Pain and Vomiting. Musculoskeletal Not Present- Back Pain, Joint Pain, Joint Stiffness, Muscle Pain, Muscle Weakness and Swelling of Extremities. Neurological Not Present- Decreased Memory, Fainting, Headaches, Numbness, Seizures, Tingling,  Tremor, Trouble walking and Weakness. Psychiatric Present- Anxiety. Not Present- Bipolar, Change in Sleep Pattern,  Depression, Fearful and Frequent crying. Endocrine Present- Hot flashes. Not Present- Cold Intolerance, Excessive Hunger, Hair Changes, Heat Intolerance and New Diabetes. Hematology Not Present- Blood Thinners, Easy Bruising, Excessive bleeding, Gland problems, HIV and Persistent Infections.  Vitals Weight: 140 lb Height: 62in Body Surface Area: 1.64 m Body Mass Index: 25.61 kg/m  Pain Level: 0/10 Temp.: 97.34F(Temporal)  Pulse: 77 (Regular)  P.OX: 98% (Room air) BP: 104/72 (Sitting, Left Arm, Standard)  Physical Exam   See vital signs recorded above  GENERAL APPEARANCE Development: normal Nutritional status: normal Gross deformities: none  SKIN Rash, lesions, ulcers: none Induration, erythema: none Nodules: none palpable  EYES Conjunctiva and lids: normal Pupils: equal and reactive Iris: normal bilaterally  EARS, NOSE, MOUTH, THROAT External ears: no lesion or deformity External nose: no lesion or deformity Hearing: grossly normal Lips: no lesion or deformity Dentition: normal for age Oral mucosa: moist  NECK Symmetric: yes Trachea: midline Thyroid: Left thyroid lobe is normal to palpation. There is a firm fixed nodule located anteriorly in the right side of the isthmus measuring at least 1.5 cm in size. It is nontender. There is no other palpable abnormality of the right thyroid lobe. There is no palpable lymphadenopathy.  CHEST Respiratory effort: normal Retraction or accessory muscle use: no Breath sounds: normal bilaterally Rales, rhonchi, wheeze: none  CARDIOVASCULAR Auscultation: regular rhythm, normal rate Murmurs: none Pulses: carotid and radial pulse 2+ palpable Lower extremity edema: none Lower extremity varicosities: none  MUSCULOSKELETAL Station and gait: normal Digits and nails: no clubbing or cyanosis Muscle  strength: grossly normal all extremities Range of motion: grossly normal all extremities Deformity: none  LYMPHATIC Cervical: none palpable Supraclavicular: none palpable  PSYCHIATRIC Oriented to person, place, and time: yes Mood and affect: normal for situation Judgment and insight: appropriate for situation   Assessment & Plan  PAPILLARY THYROID CARCINOMA (C73)  Pt Education - Pamphlet Given - The Thyroid Book: discussed with patient and provided information.  Patient is referred by Dr. [name] for surgical evaluation and management of newly diagnosed papillary thyroid carcinoma. Patient is given written literature on thyroid surgery to review at home.  Patient has a 1.8 cm tumor arising in the thyroid isthmus and anterior right thyroid lobe. It appears on imaging studies that there is direct involvement of the overlying strap muscle. There may be an enlarged lymph node located in zone VI. After review of her studies and discussion with the patient, I have recommended total thyroidectomy with limited lymph node dissection as the surgical procedure of choice. We have discussed the potential for injury to recurrent laryngeal nerves or 2 superior laryngeal nerves during the procedure. We have discussed a resulting change in voice quality and in her singing ability. We have discussed alternative surgical techniques using nerve monitoring or performing less than total thyroidectomy. These procedures may need to be done by a different surgical specialist. Patient agrees with my recommendation for total thyroidectomy with limited lymph node dissection. She would like to proceed with surgery with my practice. We also discussed parathyroid glands and their surgical management. We discussed the need for calcium supplementation following her procedure. We discussed the hospital stay. We discussed the location of the surgical incision. We discussed the need for lifelong thyroid  hormone replacement. We discussed the need for endocrinology consultation after surgery. We will have her see Dr. Dagmar Hait in the [place] [facility] postoperatively.  The risks and benefits of the procedure have been discussed at length with the patient. The patient  understands the proposed procedure, potential alternative treatments, and the course of recovery to be expected. All of the patient's questions have been answered at this time. The patient wishes to proceed with surgery.  Armandina Gemma, [place] [facility] Office: [phone]

## 2018-01-18 ENCOUNTER — Ambulatory Visit (HOSPITAL_COMMUNITY): Payer: BC Managed Care – PPO | Admitting: Anesthesiology

## 2018-01-18 ENCOUNTER — Encounter (HOSPITAL_COMMUNITY): Payer: Self-pay

## 2018-01-18 ENCOUNTER — Observation Stay (HOSPITAL_COMMUNITY)
Admission: RE | Admit: 2018-01-18 | Discharge: 2018-01-19 | Disposition: A | Discharge status: Home / Self Care | Payer: BC Managed Care – PPO | Source: Ambulatory Visit | Attending: Surgery | Admitting: Surgery

## 2018-01-18 ENCOUNTER — Other Ambulatory Visit: Payer: Self-pay

## 2018-01-18 ENCOUNTER — Encounter (HOSPITAL_COMMUNITY)
Admission: RE | Disposition: A | Discharge status: Home / Self Care | Payer: Self-pay | Source: Ambulatory Visit | Attending: Surgery

## 2018-01-18 DIAGNOSIS — Z880 Allergy status to penicillin: Secondary | ICD-10-CM | POA: Insufficient documentation

## 2018-01-18 DIAGNOSIS — C73 Malignant neoplasm of thyroid gland: Principal | ICD-10-CM | POA: Diagnosis present

## 2018-01-18 DIAGNOSIS — M797 Fibromyalgia: Secondary | ICD-10-CM | POA: Insufficient documentation

## 2018-01-18 DIAGNOSIS — Z881 Allergy status to other antibiotic agents status: Secondary | ICD-10-CM | POA: Insufficient documentation

## 2018-01-18 DIAGNOSIS — Z79899 Other long term (current) drug therapy: Secondary | ICD-10-CM | POA: Diagnosis not present

## 2018-01-18 DIAGNOSIS — G43909 Migraine, unspecified, not intractable, without status migrainosus: Secondary | ICD-10-CM | POA: Insufficient documentation

## 2018-01-18 HISTORY — PX: THYROIDECTOMY: SHX17

## 2018-01-18 SURGERY — THYROIDECTOMY
Anesthesia: General | Site: Neck

## 2018-01-18 MED ORDER — HEMOSTATIC AGENTS (NO CHARGE) OPTIME
TOPICAL | Status: DC | PRN
  Administered 2018-01-18: 1 via TOPICAL

## 2018-01-18 MED ORDER — SODIUM CHLORIDE 0.9 % IV SOLN
INTRAVENOUS | Status: DC | PRN
  Administered 2018-01-18: 20 ug/min via INTRAVENOUS

## 2018-01-18 MED ORDER — ACETAMINOPHEN 650 MG RE SUPP: 650.0000 mg | Freq: Four times a day (QID) | RECTAL | Status: DC | PRN

## 2018-01-18 MED ORDER — OXYCODONE HCL 5 MG/5ML PO SOLN: 5.0000 mg | Freq: Once | ORAL | Status: DC | PRN

## 2018-01-18 MED ORDER — HYDROCODONE-ACETAMINOPHEN 5-325 MG PO TABS: 1.0000 | ORAL_TABLET | ORAL | Status: DC | PRN

## 2018-01-18 MED ORDER — CALCIUM CARBONATE ANTACID 1250 MG/5ML PO SUSP
1000.0000 mg | Freq: Three times a day (TID) | ORAL | Status: DC
  Administered 2018-01-18 – 2018-01-19 (×2): 1000 mg via ORAL
  Filled 2018-01-18 (×4): qty 5

## 2018-01-18 MED ORDER — ACETAMINOPHEN 500 MG PO TABS
ORAL_TABLET | ORAL | Status: AC
  Filled 2018-01-18: qty 2

## 2018-01-18 MED ORDER — HYDROMORPHONE HCL 1 MG/ML IJ SOLN
0.2500 mg | INTRAMUSCULAR | Status: DC | PRN
  Administered 2018-01-18: 0.5 mg via INTRAVENOUS

## 2018-01-18 MED ORDER — ONDANSETRON HCL 4 MG/2ML IJ SOLN
INTRAMUSCULAR | Status: DC | PRN
  Administered 2018-01-18: 4 mg via INTRAVENOUS

## 2018-01-18 MED ORDER — PROPOFOL 10 MG/ML IV BOLUS
INTRAVENOUS | Status: DC | PRN
  Administered 2018-01-18: 150 mg via INTRAVENOUS

## 2018-01-18 MED ORDER — MIDAZOLAM HCL 5 MG/5ML IJ SOLN
INTRAMUSCULAR | Status: DC | PRN
  Administered 2018-01-18: 2 mg via INTRAVENOUS

## 2018-01-18 MED ORDER — LIDOCAINE 2% (20 MG/ML) 5 ML SYRINGE
INTRAMUSCULAR | Status: DC | PRN
  Administered 2018-01-18: 60 mg via INTRAVENOUS

## 2018-01-18 MED ORDER — PHENYLEPHRINE 40 MCG/ML (10ML) SYRINGE FOR IV PUSH (FOR BLOOD PRESSURE SUPPORT)
PREFILLED_SYRINGE | INTRAVENOUS | Status: DC | PRN
  Administered 2018-01-18: 40 ug via INTRAVENOUS
  Administered 2018-01-18: 80 ug via INTRAVENOUS

## 2018-01-18 MED ORDER — OXYCODONE HCL 5 MG PO TABS: 5.0000 mg | ORAL_TABLET | Freq: Once | ORAL | Status: DC | PRN

## 2018-01-18 MED ORDER — SUMATRIPTAN SUCCINATE 100 MG PO TABS: 100.0000 mg | ORAL_TABLET | ORAL | Status: DC | PRN

## 2018-01-18 MED ORDER — PROMETHAZINE HCL 25 MG/ML IJ SOLN: 6.2500 mg | INTRAMUSCULAR | Status: DC | PRN

## 2018-01-18 MED ORDER — MIDAZOLAM HCL 2 MG/2ML IJ SOLN
INTRAMUSCULAR | Status: AC
  Filled 2018-01-18: qty 2

## 2018-01-18 MED ORDER — LACTATED RINGERS IV SOLN
INTRAVENOUS | Status: DC
  Administered 2018-01-18: 08:00:00 via INTRAVENOUS

## 2018-01-18 MED ORDER — ACETAMINOPHEN 325 MG PO TABS
650.0000 mg | ORAL_TABLET | Freq: Four times a day (QID) | ORAL | Status: DC | PRN
  Administered 2018-01-18: 650 mg via ORAL
  Filled 2018-01-18: qty 2

## 2018-01-18 MED ORDER — ONDANSETRON HCL 4 MG/2ML IJ SOLN
4.0000 mg | Freq: Four times a day (QID) | INTRAMUSCULAR | Status: DC | PRN
  Administered 2018-01-18: 4 mg via INTRAVENOUS
  Filled 2018-01-18: qty 2

## 2018-01-18 MED ORDER — CIPROFLOXACIN IN D5W 400 MG/200ML IV SOLN
400.0000 mg | INTRAVENOUS | Status: AC
  Administered 2018-01-18: 400 mg via INTRAVENOUS
  Filled 2018-01-18: qty 200

## 2018-01-18 MED ORDER — HYDROMORPHONE HCL 1 MG/ML IJ SOLN
INTRAMUSCULAR | Status: AC
  Filled 2018-01-18: qty 1

## 2018-01-18 MED ORDER — HYDROMORPHONE HCL 1 MG/ML IJ SOLN
1.0000 mg | INTRAMUSCULAR | Status: DC | PRN
  Administered 2018-01-18: 1 mg via INTRAVENOUS
  Filled 2018-01-18: qty 1

## 2018-01-18 MED ORDER — FENTANYL CITRATE (PF) 250 MCG/5ML IJ SOLN
INTRAMUSCULAR | Status: DC | PRN
  Administered 2018-01-18: 50 ug via INTRAVENOUS
  Administered 2018-01-18: 100 ug via INTRAVENOUS

## 2018-01-18 MED ORDER — CHLORHEXIDINE GLUCONATE CLOTH 2 % EX PADS: 6.0000 | MEDICATED_PAD | Freq: Once | CUTANEOUS | Status: DC

## 2018-01-18 MED ORDER — HYDROMORPHONE HCL 1 MG/ML IJ SOLN: 0.2500 mg | INTRAMUSCULAR | Status: DC | PRN

## 2018-01-18 MED ORDER — DEXAMETHASONE SODIUM PHOSPHATE 10 MG/ML IJ SOLN
INTRAMUSCULAR | Status: DC | PRN
  Administered 2018-01-18: 8 mg via INTRAVENOUS

## 2018-01-18 MED ORDER — KCL IN DEXTROSE-NACL 20-5-0.45 MEQ/L-%-% IV SOLN
INTRAVENOUS | Status: DC
  Administered 2018-01-18: 18:00:00 via INTRAVENOUS
  Filled 2018-01-18: qty 1000

## 2018-01-18 MED ORDER — FENTANYL CITRATE (PF) 250 MCG/5ML IJ SOLN
INTRAMUSCULAR | Status: AC
  Filled 2018-01-18: qty 5

## 2018-01-18 MED ORDER — 0.9 % SODIUM CHLORIDE (POUR BTL) OPTIME
TOPICAL | Status: DC | PRN
  Administered 2018-01-18: 1000 mL

## 2018-01-18 MED ORDER — ACETAMINOPHEN 160 MG/5ML PO SOLN: 960.0000 mg | Freq: Once | ORAL | Status: AC

## 2018-01-18 MED ORDER — ONDANSETRON 4 MG PO TBDP: 4.0000 mg | ORAL_TABLET | Freq: Four times a day (QID) | ORAL | Status: DC | PRN

## 2018-01-18 MED ORDER — ROCURONIUM BROMIDE 10 MG/ML (PF) SYRINGE
PREFILLED_SYRINGE | INTRAVENOUS | Status: DC | PRN
  Administered 2018-01-18: 30 mg via INTRAVENOUS
  Administered 2018-01-18: 50 mg via INTRAVENOUS

## 2018-01-18 MED ORDER — CALCIUM CARBONATE 1250 (500 CA) MG PO TABS
2.0000 | ORAL_TABLET | Freq: Three times a day (TID) | ORAL | Status: DC
  Administered 2018-01-18: 1000 mg via ORAL
  Filled 2018-01-18 (×2): qty 1

## 2018-01-18 MED ORDER — PAROXETINE HCL 10 MG PO TABS
5.0000 mg | ORAL_TABLET | Freq: Every day | ORAL | Status: DC
  Administered 2018-01-19: 5 mg via ORAL
  Filled 2018-01-18 (×2): qty 0.5

## 2018-01-18 MED ORDER — ACETAMINOPHEN 500 MG PO TABS
1000.0000 mg | ORAL_TABLET | Freq: Once | ORAL | Status: AC
  Administered 2018-01-18: 1000 mg via ORAL

## 2018-01-18 MED ORDER — SUGAMMADEX SODIUM 200 MG/2ML IV SOLN
INTRAVENOUS | Status: DC | PRN
  Administered 2018-01-18: 200 mg via INTRAVENOUS

## 2018-01-18 MED ORDER — TRAMADOL HCL 50 MG PO TABS: 50.0000 mg | ORAL_TABLET | Freq: Four times a day (QID) | ORAL | Status: DC | PRN

## 2018-01-18 SURGICAL SUPPLY — 61 items
ADH SKN CLS APL DERMABOND .7 (GAUZE/BANDAGES/DRESSINGS) ×1
ATTRACTOMAT 16X20 MAGNETIC DRP (DRAPES) ×3 IMPLANT
BLADE CLIPPER SURG (BLADE) IMPLANT
BLADE SURG 10 STRL SS (BLADE) ×3 IMPLANT
BLADE SURG 15 STRL LF DISP TIS (BLADE) ×1 IMPLANT
BLADE SURG 15 STRL SS (BLADE) ×3
CANISTER SUCT 3000ML PPV (MISCELLANEOUS) ×3 IMPLANT
CHLORAPREP W/TINT 10.5 ML (MISCELLANEOUS) ×3 IMPLANT
CLIP VESOCCLUDE MED 24/CT (CLIP) ×3 IMPLANT
CLIP VESOCCLUDE SM WIDE 24/CT (CLIP) ×3 IMPLANT
CLOSURE WOUND 1/2 X4 (GAUZE/BANDAGES/DRESSINGS)
COVER SURGICAL LIGHT HANDLE (MISCELLANEOUS) ×3 IMPLANT
COVER WAND RF STERILE (DRAPES) ×1 IMPLANT
CRADLE DONUT ADULT HEAD (MISCELLANEOUS) ×3 IMPLANT
DERMABOND ADVANCED (GAUZE/BANDAGES/DRESSINGS) ×2
DERMABOND ADVANCED .7 DNX12 (GAUZE/BANDAGES/DRESSINGS) IMPLANT
DRAPE LAPAROTOMY 100X72 PEDS (DRAPES) ×3 IMPLANT
DRAPE UTILITY XL STRL (DRAPES) ×3 IMPLANT
ELECT CAUTERY BLADE 6.4 (BLADE) ×3 IMPLANT
ELECT REM PT RETURN 9FT ADLT (ELECTROSURGICAL) ×3
ELECTRODE REM PT RTRN 9FT ADLT (ELECTROSURGICAL) ×1 IMPLANT
GAUZE 4X4 16PLY RFD (DISPOSABLE) ×3 IMPLANT
GAUZE SPONGE 4X4 12PLY STRL (GAUZE/BANDAGES/DRESSINGS) ×1 IMPLANT
GLOVE BIO SURGEON STRL SZ 6.5 (GLOVE) ×1 IMPLANT
GLOVE BIO SURGEON STRL SZ7 (GLOVE) ×2 IMPLANT
GLOVE BIO SURGEONS STRL SZ 6.5 (GLOVE) ×1
GLOVE BIOGEL PI IND STRL 6.5 (GLOVE) IMPLANT
GLOVE BIOGEL PI IND STRL 7.0 (GLOVE) IMPLANT
GLOVE BIOGEL PI IND STRL 7.5 (GLOVE) IMPLANT
GLOVE BIOGEL PI INDICATOR 6.5 (GLOVE) ×4
GLOVE BIOGEL PI INDICATOR 7.0 (GLOVE) ×2
GLOVE BIOGEL PI INDICATOR 7.5 (GLOVE) ×2
GLOVE SURG ORTHO 8.0 STRL STRW (GLOVE) ×3 IMPLANT
GLOVE SURG SS PI 6.5 STRL IVOR (GLOVE) ×2 IMPLANT
GOWN STRL REUS W/ TWL LRG LVL3 (GOWN DISPOSABLE) ×1 IMPLANT
GOWN STRL REUS W/ TWL XL LVL3 (GOWN DISPOSABLE) ×1 IMPLANT
GOWN STRL REUS W/TWL LRG LVL3 (GOWN DISPOSABLE) ×9
GOWN STRL REUS W/TWL XL LVL3 (GOWN DISPOSABLE) ×3
HEMOSTAT ARISTA ABSORB 3G PWDR (MISCELLANEOUS) IMPLANT
HEMOSTAT SURGICEL 2X4 FIBR (HEMOSTASIS) ×3 IMPLANT
ILLUMINATOR WAVEGUIDE N/F (MISCELLANEOUS) ×2 IMPLANT
KIT BASIN OR (CUSTOM PROCEDURE TRAY) ×3 IMPLANT
KIT TURNOVER KIT B (KITS) ×3 IMPLANT
LIGHT WAVEGUIDE WIDE FLAT (MISCELLANEOUS) IMPLANT
NS IRRIG 1000ML POUR BTL (IV SOLUTION) ×3 IMPLANT
PACK SURGICAL SETUP 50X90 (CUSTOM PROCEDURE TRAY) ×3 IMPLANT
PAD ARMBOARD 7.5X6 YLW CONV (MISCELLANEOUS) ×3 IMPLANT
PENCIL BUTTON HOLSTER BLD 10FT (ELECTRODE) ×3 IMPLANT
SHEARS HARMONIC 9CM CVD (BLADE) ×3 IMPLANT
SPECIMEN JAR MEDIUM (MISCELLANEOUS) ×3 IMPLANT
SPONGE INTESTINAL PEANUT (DISPOSABLE) ×3 IMPLANT
STRIP CLOSURE SKIN 1/2X4 (GAUZE/BANDAGES/DRESSINGS) ×1 IMPLANT
SUT MNCRL AB 4-0 PS2 18 (SUTURE) ×3 IMPLANT
SUT SILK 2 0 (SUTURE) ×3
SUT SILK 2-0 18XBRD TIE 12 (SUTURE) ×1 IMPLANT
SUT VIC AB 3-0 SH 18 (SUTURE) ×6 IMPLANT
SYR BULB 3OZ (MISCELLANEOUS) ×3 IMPLANT
TOWEL OR 17X24 6PK STRL BLUE (TOWEL DISPOSABLE) ×3 IMPLANT
TOWEL OR 17X26 10 PK STRL BLUE (TOWEL DISPOSABLE) ×3 IMPLANT
TUBE CONNECTING 12'X1/4 (SUCTIONS) ×1
TUBE CONNECTING 12X1/4 (SUCTIONS) ×2 IMPLANT

## 2018-01-18 NOTE — Progress Notes (Signed)
Pt received back from PACU s/p thyroidectomy Dr. Harlow Asa.  Anterior neck incision with dermabond is clean, dry and has no bleeding noted.

## 2018-01-18 NOTE — Anesthesia Postprocedure Evaluation (Signed)
Anesthesia Post Note  Patient: [name]  Procedure(s) Performed: TOTAL THYROIDECTOMY (N/A Neck)     Patient location during evaluation: PACU Anesthesia Type: General Level of consciousness: awake and alert Pain management: pain level controlled Vital Signs Assessment: post-procedure vital signs reviewed and stable Respiratory status: spontaneous breathing, nonlabored ventilation, respiratory function stable and patient connected to nasal cannula oxygen Cardiovascular status: blood pressure returned to baseline and stable Postop Assessment: no apparent nausea or vomiting Anesthetic complications: no    Last Vitals:  Vitals:   01/18/18 1355 01/18/18 1430  BP: 103/63 114/68  Pulse: 81 72  Resp: 15 10  Temp:    SpO2: 95% 95%    Last Pain:  Vitals:   01/18/18 1430  TempSrc:   PainSc: 0-No pain                 Misheel Gowans P Zandria Woldt

## 2018-01-18 NOTE — Anesthesia Procedure Notes (Signed)
Procedure Name: Intubation Date/Time: 01/18/2018 9:36 AM Performed by: Marsa Aris, CRNA Pre-anesthesia Checklist: Patient identified, Emergency Drugs available, Suction available and Patient being monitored Patient Re-evaluated:Patient Re-evaluated prior to induction Oxygen Delivery Method: Circle System Utilized Preoxygenation: Pre-oxygenation with 100% oxygen Induction Type: IV induction Ventilation: Mask ventilation without difficulty Laryngoscope Size: Miller and 2 Grade View: Grade I Tube type: Oral Number of attempts: 1 Airway Equipment and Method: Stylet and Oral airway Placement Confirmation: ETT inserted through vocal cords under direct vision,  positive ETCO2 and breath sounds checked- equal and bilateral Secured at: 22 cm Tube secured with: Tape Dental Injury: Teeth and Oropharynx as per pre-operative assessment  Comments: No change in dentition from pre-procedure

## 2018-01-18 NOTE — Anesthesia Preprocedure Evaluation (Addendum)
Anesthesia Evaluation  Patient identified by MRN, date of birth, ID band Patient awake    Reviewed: Allergy & Precautions, NPO status , Patient's Chart, lab work & pertinent test results  Airway Mallampati: III  TM Distance: >3 FB Neck ROM: Full    Dental no notable dental hx.    Pulmonary neg pulmonary ROS,    Pulmonary exam normal breath sounds clear to auscultation       Cardiovascular negative cardio ROS Normal cardiovascular exam Rhythm:Regular Rate:Normal     Neuro/Psych  Headaches, Anxiety    GI/Hepatic Neg liver ROS, GERD  Controlled,  Endo/Other  negative endocrine ROS  Renal/GU negative Renal ROS     Musculoskeletal  (+) Fibromyalgia -  Abdominal   Peds  Hematology HLD   Anesthesia Other Findings PAPILLARY THYROID CARCINOMA  Reproductive/Obstetrics S/p hysterectomy                           Anesthesia Physical Anesthesia Plan  ASA: II  Anesthesia Plan: General   Post-op Pain Management:    Induction: Intravenous  PONV Risk Score and Plan: 3 and Midazolam, Dexamethasone, Ondansetron and Treatment may vary due to age or medical condition  Airway Management Planned: Oral ETT  Additional Equipment:   Intra-op Plan:   Post-operative Plan: Extubation in OR  Informed Consent: I have reviewed the patients History and Physical, chart, labs and discussed the procedure including the risks, benefits and alternatives for the proposed anesthesia with the patient or authorized representative who has indicated his/her understanding and acceptance.   Dental advisory given  Plan Discussed with: CRNA  Anesthesia Plan Comments:         Anesthesia Quick Evaluation

## 2018-01-18 NOTE — Op Note (Signed)
Procedure Note  Pre-operative Diagnosis:  Papillary thyroid carcinoma  Post-operative Diagnosis:  same  Surgeon:  Armandina Gemma, MD  Assistant:  Carlena Hurl, [facility]   Procedure:  Total thyroidectomy  Anesthesia:  General  Estimated Blood Loss:  minimal  Drains: none         Specimen: thyroid to pathology  Indications:  Patient is referred by Dr. [name] for surgical evaluation and management of papillary thyroid carcinoma. Patient's primary care physician is Dr. [name] at [facility] at [place]. 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 On self-examination she noted a nodule in the anterior right neck. This persisted and became slightly larger. She presented to Dr. [name] for evaluation. Patient underwent a thyroid ultrasound on November 06, 2017. This showed a normal size thyroid gland with a solitary nodule in the isthmus and anterior right thyroid lobe measuring 1.8 x 1.7 x 1.0 cm. It was felt to be highly suspicious and biopsy was recommended. There appeared to be extra thyroidal extension into the overlying strap muscle. A 6 mm hypoechoic nodule was also noted just inferior to the left thyroid lobe, perhaps representing a metastatic lymph node. Biopsy confirmed papillary thyroid carcinoma. Patient subsequently underwent a CT scan of the neck which demonstrated the mass but no evidence of lymphadenopathy. Again there appears to be extra thyroidal extension into the overlying strap muscle. Patient has never been on thyroid medication. She has had no prior thyroid problems. She has had no prior head or neck surgery. There is no family history of thyroid disease or thyroid cancer. Patient denies tremors or palpitations. She presents today to discuss surgical management.  Procedure Details: Procedure was done in OR #2 at the [facility].  The patient  was brought to the operating room and placed in a supine position on the operating room table.  Following administration of general anesthesia, the patient was positioned and then prepped and draped in the usual aseptic fashion.  After ascertaining that an adequate level of anesthesia had been achieved, a Kocher incision was made with #15 blade.  Dissection was carried through subcutaneous tissues and platysma. Hemostasis was achieved with the electrocautery.  Skin flaps were elevated cephalad and caudad from the thyroid notch to the sternal notch.  The Mahorner self-retaining retractor was placed for exposure.  Strap muscles were incised in the midline and dissection was begun on the left side.  Strap muscles were reflected laterally.  Left thyroid lobe was normal in size and without nodules.  The left lobe was gently mobilized with blunt dissection.  Superior pole vessels were dissected out and divided individually between small and medium Ligaclips with the Harmonic scalpel.  The thyroid lobe was rolled anteriorly.  Branches of the inferior thyroid artery were divided between small Ligaclips with the Harmonic scalpel.  Inferior venous tributaries were divided between Ligaclips.  Both the superior and inferior parathyroid glands were identified and preserved on their vascular pedicles.  The recurrent laryngeal nerve was identified and preserved along its course.  The ligament of Gwenlyn Found was released with the electrocautery and the gland was mobilized onto the anterior trachea. Isthmus was mobilized across the midline.  There was a moderate sized pyramidal lobe present which was dissected off of the thyroid cartilage and resected with the isthmus.  Dry pack was placed in the left neck.  Next, the right thyroid lobe was gently mobilized with blunt  dissection.  Right thyroid lobe was mildly enlarged with a dominant nodule located in the medial portion of the lobe and extending into the isthmus.  A portion of the  overlying strap muscle was resected with the thyroid tissue.  Superior pole vessels were dissected out and divided between small and medium Ligaclips with the Harmonic scalpel.  Superior parathyroid was identified and preserved.  Inferior venous tributaries were divided between medium Ligaclips with the Harmonic scalpel.  The right thyroid lobe was rolled anteriorly and the branches of the inferior thyroid artery divided between small Ligaclips.  The right recurrent laryngeal nerve was identified and preserved along its course.  The ligament of Gwenlyn Found was released with the electrocautery.  The right thyroid lobe was mobilized onto the anterior trachea and the remainder of the thyroid was dissected off the anterior trachea and the thyroid was completely excised.  A suture was used to mark the right lobe. The entire thyroid gland was submitted to pathology for review.  The neck was explored and no evidence of lymphadenopathy was found.  The 15mm hypoechoic abnormality in the inferior left neck appeared to be either adipose tissue or thymic remnant.  There were no obviously enlarged or pathologic lymph nodes noted.  The neck was irrigated with warm saline.  Fibrillar was placed throughout the operative field.  Strap muscles were reapproximated in the midline with interrupted 3-0 Vicryl sutures.  Platysma was closed with interrupted 3-0 Vicryl sutures.  Skin was closed with a running 4-0 Monocryl subcuticular suture.  Wound was washed and dried and steri-strips were applied.  Dry gauze dressing was placed.  The patient was awakened from anesthesia and brought to the recovery room.  The patient tolerated the procedure well.   Armandina Gemma, MD [facility] Office: [phone]

## 2018-01-18 NOTE — Transfer of Care (Signed)
Immediate Anesthesia Transfer of Care Note  Patient: [name]  Procedure(s) Performed: TOTAL THYROIDECTOMY (N/A Neck)  Patient Location: PACU  Anesthesia Type:General  Level of Consciousness: awake, alert  and oriented  Airway & Oxygen Therapy: Patient Spontanous Breathing  Post-op Assessment: Report given to RN and Post -op Vital signs reviewed and stable  Post vital signs: Reviewed and stable  Last Vitals:  Vitals Value Taken Time  BP 130/93 01/18/2018 11:26 AM  Temp 36.1 C 01/18/2018 11:26 AM  Pulse 96 01/18/2018 11:28 AM  Resp 19 01/18/2018 11:28 AM  SpO2 100 % 01/18/2018 11:28 AM  Vitals shown include unvalidated device data.  Last Pain:  Vitals:   01/18/18 0804  TempSrc:   PainSc: 0-No pain      Patients Stated Pain Goal: 3 (29/51/88 4166)  Complications: No apparent anesthesia complications

## 2018-01-18 NOTE — Interval H&P Note (Signed)
History and Physical Interval Note:  01/18/2018 9:13 AM  [name]  has presented today for surgery, with the diagnosis of PAPILLARY THYROID CARCINOMA.  The various methods of treatment have been discussed with the patient and family. After consideration of risks, benefits and other options for treatment, the patient has consented to    Procedure(s): TOTAL THYROIDECTOMY WITH LIMITED LYMPH NODE DISSECTION (N/A) as a surgical intervention .    The patient's history has been reviewed, patient examined, no change in status, stable for surgery.  I have reviewed the patient's chart and labs.  Questions were answered to the patient's satisfaction.    Armandina Gemma, [place] [facility] Office: [place]

## 2018-01-19 ENCOUNTER — Encounter (HOSPITAL_COMMUNITY): Payer: Self-pay | Admitting: Surgery

## 2018-01-19 DIAGNOSIS — C73 Malignant neoplasm of thyroid gland: Secondary | ICD-10-CM | POA: Diagnosis not present

## 2018-01-19 LAB — BASIC METABOLIC PANEL
Anion gap: 6 (ref 5–15)
BUN: 12 mg/dL (ref 6–20)
CALCIUM: 7.8 mg/dL — AB (ref 8.9–10.3)
CHLORIDE: 104 mmol/L (ref 98–111)
CO2: 25 mmol/L (ref 22–32)
Creatinine, Ser: 0.77 mg/dL (ref 0.44–1.00)
GLUCOSE: 139 mg/dL — AB (ref 70–99)
POTASSIUM: 4.3 mmol/L (ref 3.5–5.1)
Sodium: 135 mmol/L (ref 135–145)

## 2018-01-19 MED ORDER — LEVOTHYROXINE SODIUM 88 MCG PO TABS: 88.0000 ug | ORAL_TABLET | Freq: Every day | ORAL | 3 refills | Status: AC

## 2018-01-19 MED ORDER — CALCITRIOL 0.25 MCG PO CAPS: 0.2500 ug | ORAL_CAPSULE | Freq: Two times a day (BID) | ORAL | 0 refills | Status: AC

## 2018-01-19 MED ORDER — TRAMADOL HCL 50 MG PO TABS: 50.0000 mg | ORAL_TABLET | Freq: Four times a day (QID) | ORAL | 0 refills | Status: DC | PRN

## 2018-01-19 MED ORDER — CALCIUM GLUCONATE-NACL 2-0.675 GM/100ML-% IV SOLN
2.0000 g | INTRAVENOUS | Status: AC
  Administered 2018-01-19: 2000 mg via INTRAVENOUS
  Filled 2018-01-19: qty 100

## 2018-01-19 MED ORDER — CALCIUM CARBONATE ANTACID 500 MG PO CHEW: 2.0000 | CHEWABLE_TABLET | Freq: Four times a day (QID) | ORAL | 1 refills | Status: DC

## 2018-01-19 NOTE — Discharge Summary (Signed)
Physician Discharge Summary [facility]  Patient ID: [name] MRN: [id number] DOB/AGE: [birth year] 52 y.o.  Admit date: 01/18/2018 Discharge date: 01/19/2018  Admission Diagnoses:  Papillary thyroid carcinoma  Discharge Diagnoses:  Principal Problem:   Papillary thyroid carcinoma [facility])   Discharged Condition: good  Hospital Course: Patient was admitted for observation following thyroid surgery.  Post op course was uncomplicated.  Pain was well controlled.  Tolerated diet.  Post op calcium level on morning following surgery was 7.8 mg/dl.  Calcium gluconate 2gm IV was administered prior to discharge.  Patient was prepared for discharge home on POD#1.  Consults: None  Treatments: surgery: total thyroidectomy  Discharge Exam: Blood pressure (!) 101/57, pulse 73, temperature 98.4 F (36.9 C), temperature source Oral, resp. rate 18, height 5\' 2"  (1.575 m), weight 61.2 kg, last menstrual period 08/05/2013, SpO2 99 %. HEENT - clear Neck - wound dry and intact; minimal STS; voice normal; Dermabond in place   Disposition: Home  Discharge Instructions    Diet - low sodium heart healthy   Complete by:  As directed    Discharge instructions   Complete by:  As directed    [facility]  THYROID & PARATHYROID SURGERY:  POST-OP INSTRUCTIONS  Always review your discharge instruction sheet from the facility where your surgery was performed.  A prescription for pain medication may be given to you upon discharge.  Take your pain medication as prescribed.  If narcotic pain medicine is not needed, then you may take acetaminophen (Tylenol) or ibuprofen (Advil) as needed.  Take your usually prescribed medications unless otherwise directed.  If you need a refill on your pain medication, please contact our office during regular business hours.  Prescriptions cannot be processed by our office after 5 pm or on weekends.  Start with a light diet upon  arrival home, such as soup and crackers or toast.  Be sure to drink plenty of fluids daily.  Resume your normal diet the day after surgery.  Most patients will experience some swelling and bruising on the chest and neck area.  Ice packs will help.  Swelling and bruising can take several days to resolve.   It is common to experience some constipation after surgery.  Increasing fluid intake and taking a stool softener (Colace) will usually help or prevent this problem.  A mild laxative (Milk of Magnesia or Miralax) should be taken according to package directions if there has been no bowel movement after 48 hours.  You have steri-strips and a gauze dressing over your incision.  You may remove the gauze bandage on the second day after surgery, and you may shower at that time.  Leave your steri-strips (small skin tapes) in place directly over the incision.  These strips should remain on the skin for 5-7 days and then be removed.  You may get them wet in the shower and pat them dry.  You may resume regular (light) daily activities beginning the next day (such as daily self-care, walking, climbing stairs) gradually increasing activities as tolerated.  You may have sexual intercourse when it is comfortable.  Refrain from any heavy lifting or straining until approved by your doctor.  You may drive when you no longer are taking prescription pain medication, you can comfortably wear a seatbelt, and you can safely maneuver your car and apply brakes.  You should see your doctor in the office for a follow-up appointment approximately three weeks after your surgery.  Make sure that you call for this appointment within a day or two after you arrive home to insure a convenient appointment time.  WHEN TO CALL YOUR DOCTOR: -- Fever greater than 101.5 -- Inability to urinate -- Nausea and/or vomiting - persistent -- Extreme swelling or bruising -- Continued bleeding from incision -- Increased pain, redness, or  drainage from the incision -- Difficulty swallowing or breathing -- Muscle cramping or spasms -- Numbness or tingling in hands or around lips  The clinic staff is available to answer your questions during regular business hours.  Please don't hesitate to call and ask to speak to one of the nurses if you have concerns.  Armandina Gemma, MD [facility] Office: [phone]   Increase activity slowly   Complete by:  As directed    No dressing needed   Complete by:  As directed      Allergies as of 01/19/2018      Reactions   Zithromax [azithromycin] Rash   Fever and rash on back of knees   Caffeine Other (See Comments)   migraines   Chocolate    atypical migraine   Tetracyclines & Related Nausea And Vomiting, Other (See Comments)   fever   Tomato    atypical migraine   Penicillins Rash   Has patient had a PCN reaction causing immediate rash, facial/tongue/throat swelling, SOB or lightheadedness with hypotension: Yes Has patient had a PCN reaction causing severe rash involving mucus membranes or skin necrosis: No Has patient had a PCN reaction that required hospitalization: No Has patient had a PCN reaction occurring within the last 10 years: Yes If all of the above answers are "NO", then may proceed with Cephalosporin use.   Yellow Dyes (non-tartrazine)    atypical migraine      Medication List    TAKE these medications   calcitRIOL 0.25 MCG capsule Commonly known as:  ROCALTROL Take 1 capsule (0.25 mcg total) by mouth 2 (two) times daily for 10 days.   calcium carbonate 500 MG chewable tablet Commonly known as:  TUMS - dosed in mg elemental calcium Chew 2 tablets (400 mg of elemental calcium total) by mouth 4 (four) times daily.   ELDERBERRY PO Take 1 tablet by mouth every 4 (four) hours as needed (immune support).   levothyroxine 88 MCG tablet Commonly known as:  SYNTHROID, LEVOTHROID Take 1 tablet (88 mcg total) by mouth daily before breakfast.     meloxicam 15 MG tablet Commonly known as:  MOBIC Take 15 mg by mouth daily as needed for pain (for fibromyalgia).   NETI POT SINUS [place] NA Place 1 Dose into the nose daily as needed (congestion).   PARoxetine 10 MG tablet Commonly known as:  PAXIL Take 5 mg by mouth daily.   sodium chloride 0.65 % Soln nasal spray Commonly known as:  OCEAN Place 1 spray into both nostrils as needed for congestion.   SUMAtriptan 100 MG tablet Commonly known as:  IMITREX Take 100 mg by mouth every 2 (two) hours as needed for migraine. May repeat in 2 hours if headache persists or recurs.   traMADol 50 MG tablet Commonly known as:  ULTRAM Take 1-2 tablets (50-100 mg total) by mouth every 6 (six) hours as needed for moderate pain.      Follow-up Information    Armandina Gemma, MD. Schedule an appointment as soon as possible for a visit in 3 week(s).   Specialty:  General Surgery Contact information: [address] [place] [address] [phone]           Klohe Lovering M. Owain Eckerman, MD, [facility] Office: [phone]   Signed: Earnstine Regal 01/19/2018, 7:59 AM

## 2018-02-17 ENCOUNTER — Other Ambulatory Visit: Payer: Self-pay | Admitting: Internal Medicine

## 2018-02-17 DIAGNOSIS — C73 Malignant neoplasm of thyroid gland: Secondary | ICD-10-CM

## 2018-02-22 ENCOUNTER — Ambulatory Visit
Admission: RE | Admit: 2018-02-22 | Discharge: 2018-02-22 | Disposition: A | Discharge status: Home / Self Care | Payer: BC Managed Care – PPO | Source: Ambulatory Visit | Attending: Internal Medicine | Admitting: Internal Medicine

## 2018-02-22 DIAGNOSIS — C73 Malignant neoplasm of thyroid gland: Secondary | ICD-10-CM

## 2018-03-30 ENCOUNTER — Other Ambulatory Visit (HOSPITAL_COMMUNITY): Payer: Self-pay | Admitting: Internal Medicine

## 2018-03-30 DIAGNOSIS — C73 Malignant neoplasm of thyroid gland: Secondary | ICD-10-CM

## 2018-03-31 ENCOUNTER — Ambulatory Visit (HOSPITAL_COMMUNITY)
Admission: RE | Admit: 2018-03-31 | Discharge: 2018-03-31 | Disposition: A | Discharge status: Home / Self Care | Payer: BC Managed Care – PPO | Source: Ambulatory Visit

## 2018-03-31 ENCOUNTER — Ambulatory Visit (HOSPITAL_COMMUNITY)
Admission: RE | Admit: 2018-03-31 | Discharge: 2018-03-31 | Disposition: A | Discharge status: Home / Self Care | Payer: BC Managed Care – PPO | Source: Ambulatory Visit | Attending: Internal Medicine | Admitting: Internal Medicine

## 2018-03-31 ENCOUNTER — Encounter (HOSPITAL_COMMUNITY): Payer: Self-pay

## 2018-03-31 DIAGNOSIS — C73 Malignant neoplasm of thyroid gland: Secondary | ICD-10-CM | POA: Insufficient documentation

## 2018-03-31 MED ORDER — THYROTROPIN ALFA 1.1 MG IM SOLR
0.9000 mg | INTRAMUSCULAR | Status: AC
  Administered 2018-03-31: 0.9 mg via INTRAMUSCULAR

## 2018-03-31 MED ORDER — STERILE WATER FOR INJECTION IJ SOLN
INTRAMUSCULAR | Status: AC
  Filled 2018-03-31: qty 10

## 2018-04-01 ENCOUNTER — Ambulatory Visit (HOSPITAL_COMMUNITY)
Admission: RE | Admit: 2018-04-01 | Discharge: 2018-04-01 | Disposition: A | Discharge status: Home / Self Care | Payer: BC Managed Care – PPO | Source: Ambulatory Visit | Attending: Internal Medicine | Admitting: Internal Medicine

## 2018-04-01 ENCOUNTER — Other Ambulatory Visit (HOSPITAL_COMMUNITY): Payer: Self-pay | Admitting: Internal Medicine

## 2018-04-01 ENCOUNTER — Ambulatory Visit (HOSPITAL_COMMUNITY)
Admission: RE | Admit: 2018-04-01 | Discharge: 2018-04-01 | Disposition: A | Discharge status: Home / Self Care | Payer: BC Managed Care – PPO | Source: Ambulatory Visit

## 2018-04-01 DIAGNOSIS — C73 Malignant neoplasm of thyroid gland: Secondary | ICD-10-CM

## 2018-04-01 MED ORDER — STERILE WATER FOR INJECTION IJ SOLN
INTRAMUSCULAR | Status: AC
  Filled 2018-04-01: qty 10

## 2018-04-01 MED ORDER — THYROTROPIN ALFA 1.1 MG IM SOLR
0.9000 mg | INTRAMUSCULAR | Status: AC
  Administered 2018-04-01: 0.9 mg via INTRAMUSCULAR

## 2018-04-02 ENCOUNTER — Ambulatory Visit (HOSPITAL_COMMUNITY)
Admission: RE | Admit: 2018-04-02 | Discharge: 2018-04-02 | Disposition: A | Discharge status: Home / Self Care | Payer: BC Managed Care – PPO | Source: Ambulatory Visit | Attending: Internal Medicine | Admitting: Internal Medicine

## 2018-04-02 ENCOUNTER — Encounter (HOSPITAL_COMMUNITY): Payer: BC Managed Care – PPO

## 2018-04-02 MED ORDER — SODIUM IODIDE I 131 CAPSULE
70.0000 | Freq: Once | INTRAVENOUS | Status: AC | PRN
  Administered 2018-04-02: 70 via ORAL

## 2018-04-09 ENCOUNTER — Encounter (HOSPITAL_COMMUNITY)
Admission: RE | Admit: 2018-04-09 | Discharge: 2018-04-09 | Disposition: A | Discharge status: Home / Self Care | Payer: BC Managed Care – PPO | Source: Ambulatory Visit | Attending: Internal Medicine | Admitting: Internal Medicine

## 2018-04-09 ENCOUNTER — Other Ambulatory Visit (HOSPITAL_COMMUNITY): Payer: Self-pay

## 2018-04-09 DIAGNOSIS — C73 Malignant neoplasm of thyroid gland: Secondary | ICD-10-CM | POA: Insufficient documentation

## 2018-10-19 ENCOUNTER — Other Ambulatory Visit: Payer: Self-pay

## 2018-10-19 ENCOUNTER — Ambulatory Visit: Payer: BC Managed Care – PPO | Admitting: Women's Health

## 2018-10-19 ENCOUNTER — Encounter: Payer: Self-pay | Admitting: Women's Health

## 2018-10-19 VITALS — BP 118/78 | Ht 62.0 in | Wt 139.0 lb

## 2018-10-19 DIAGNOSIS — Z01419 Encounter for gynecological examination (general) (routine) without abnormal findings: Secondary | ICD-10-CM | POA: Diagnosis not present

## 2018-10-19 DIAGNOSIS — Z90710 Acquired absence of both cervix and uterus: Secondary | ICD-10-CM

## 2018-10-19 MED ORDER — PREMARIN 0.625 MG/GM VA CREA: 1.0000 | TOPICAL_CREAM | Freq: Every day | VAGINAL | 4 refills | Status: DC

## 2018-10-19 NOTE — Progress Notes (Signed)
[name] 1965/06/14 [id number]    History:    Presents for annual exam.  2015 TAH with bilateral salpingectomies for multiple fibroids.  Having vaginal dryness with burning, minimal relief with vaginal lubricants and numerous hot flushes.  Last here 2015.  Last mammogram 2015, normal mammograms prior.  1994 cryo with normal Paps after.  01/2018 thyroid cancer margins clear.  Had a negative Cologuard has not had a screening colonoscopy.  Primary care managing labs.    Past medical history, past surgical history, family history and social history were all reviewed and documented in the EPIC chart.  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  2 sons ages 67 and 50 both with Asperger's both doing okay.  Has a Fransisco Beau.  ROS:  A ROS was performed and pertinent positives and negatives are included.  Exam:  Vitals:   10/19/18 1213  BP: 118/78  Weight: 139 lb (63 kg)  Height: 5\' 2"  (1.575 m)   Body mass index is 25.42 kg/m.   General appearance:  Normal Thyroid:  Symmetrical, normal in size, without palpable masses or nodularity. Respiratory  Auscultation:  Clear without wheezing or rhonchi Cardiovascular  Auscultation:  Regular rate, without rubs, murmurs or gallops  Edema/varicosities:  Not grossly evident Abdominal  Soft,nontender, without masses, guarding or rebound.  Liver/spleen:  No organomegaly noted  Hernia:  None appreciated  Skin  Inspection:  Grossly normal   Breasts: Examined lying and sitting.     Right: Without masses, retractions, discharge or axillary adenopathy.     Left: Without masses, retractions, discharge or axillary adenopathy. Gentitourinary   Inguinal/mons:  Normal without inguinal adenopathy  External genitalia:  Normal  BUS/Urethra/Skene's glands:  Normal  Vagina: Atrophic              cervix: And uterus absent  adnexa/parametria:     Rt: Without masses or tenderness.   Lt: Without masses or tenderness.  Anus and perineum: Normal  Digital rectal  exam: Normal sphincter tone without palpated masses or tenderness  Assessment/Plan:  53 y.o. MWF G3 P2 for annual exam with complaint of vaginal dryness/burning sensation with intercourse.  2015 TAH with bilateral salpingectomies/dyspareunia/hot flushes 01/2018 papillary thyroid cancer margins negative endocrinologist managing Labs-primary care  Plan: Options for menopausal symptoms reviewed, vaginal dryness reviewed, Premarin vaginal cream 1 applicator per vagina twice weekly, prescription, coupon card, reviewed minimal systemic effects, instructed to call if no relief of vaginal dryness.  SBEs, reviewed importance of annual screening mammogram, breast center information given instructed to schedule.  screening colonoscopy reviewed as gold standard, [facility] information given.  Calcium rich foods, vitamin D 2000 daily and continue regular exercise/treadmill and walking dog encouraged.  Huel Cote [facility]

## 2018-10-19 NOTE — Patient Instructions (Addendum)
Vit D 2000 iu daily  Breast center  [phone]  Colonoscopy  [facility] [phone]  Dr Carlean Purl   Health Maintenance for Postmenopausal Women Menopause is a normal process in which your ability to get pregnant comes to an end. This process happens slowly over many months or years, usually between the ages of 56 and 11. Menopause is complete when you have missed your menstrual periods for 12 months. It is important to talk with your health care provider about some of the most common conditions that affect women after menopause (postmenopausal women). These include heart disease, cancer, and bone loss (osteoporosis). Adopting a healthy lifestyle and getting preventive care can help to promote your health and wellness. The actions you take can also lower your chances of developing some of these common conditions. What should I know about menopause? During menopause, you may get a number of symptoms, such as:  Hot flashes. These can be moderate or severe.  Night sweats.  Decrease in sex drive.  Mood swings.  Headaches.  Tiredness.  Irritability.  Memory problems.  Insomnia. Choosing to treat or not to treat these symptoms is a decision that you make with your health care provider. Do I need hormone replacement therapy?  Hormone replacement therapy is effective in treating symptoms that are caused by menopause, such as hot flashes and night sweats.  Hormone replacement carries certain risks, especially as you become older. If you are thinking about using estrogen or estrogen with progestin, discuss the benefits and risks with your health care provider. What is my risk for heart disease and stroke? The risk of heart disease, heart attack, and stroke increases as you age. One of the causes may be a change in the body's hormones during menopause. This can affect how your body uses dietary fats, triglycerides, and cholesterol. Heart attack and stroke are medical emergencies. There are many  things that you can do to help prevent heart disease and stroke. Watch your blood pressure  High blood pressure causes heart disease and increases the risk of stroke. This is more likely to develop in people who have high blood pressure readings, are of African descent, or are overweight.  Have your blood pressure checked: ? Every 3-5 years if you are 61-54 years of age. ? Every year if you are 71 years old or older. Eat a healthy diet   Eat a diet that includes plenty of vegetables, fruits, low-fat dairy products, and lean protein.  Do not eat a lot of foods that are high in solid fats, added sugars, or sodium. Get regular exercise Get regular exercise. This is one of the most important things you can do for your health. Most adults should:  Try to exercise for at least 150 minutes each week. The exercise should increase your heart rate and make you sweat (moderate-intensity exercise).  Try to do strengthening exercises at least twice each week. Do these in addition to the moderate-intensity exercise.  Spend less time sitting. Even light physical activity can be beneficial. Other tips  Work with your health care provider to achieve or maintain a healthy weight.  Do not use any products that contain nicotine or tobacco, such as cigarettes, e-cigarettes, and chewing tobacco. If you need help quitting, ask your health care provider.  Know your numbers. Ask your health care provider to check your cholesterol and your blood sugar (glucose). Continue to have your blood tested as directed by your health care provider. Do I need screening for cancer? Depending on  your health history and family history, you may need to have cancer screening at different stages of your life. This may include screening for:  Breast cancer.  Cervical cancer.  Lung cancer.  Colorectal cancer. What is my risk for osteoporosis? After menopause, you may be at increased risk for osteoporosis. Osteoporosis is  a condition in which bone destruction happens more quickly than new bone creation. To help prevent osteoporosis or the bone fractures that can happen because of osteoporosis, you may take the following actions:  If you are 54-45 years old, get at least 1,000 mg of calcium and at least 600 mg of vitamin D per day.  If you are older than age 42 but younger than age 103, get at least 1,200 mg of calcium and at least 600 mg of vitamin D per day.  If you are older than age 47, get at least 1,200 mg of calcium and at least 800 mg of vitamin D per day. Smoking and drinking excessive alcohol increase the risk of osteoporosis. Eat foods that are rich in calcium and vitamin D, and do weight-bearing exercises several times each week as directed by your health care provider. How does menopause affect my mental health? Depression may occur at any age, but it is more common as you become older. Common symptoms of depression include:  Low or sad mood.  Changes in sleep patterns.  Changes in appetite or eating patterns.  Feeling an overall lack of motivation or enjoyment of activities that you previously enjoyed.  Frequent crying spells. Talk with your health care provider if you think that you are experiencing depression. General instructions See your health care provider for regular wellness exams and vaccines. This may include:  Scheduling regular health, dental, and eye exams.  Getting and maintaining your vaccines. These include: ? Influenza vaccine. Get this vaccine each year before the flu season begins. ? Pneumonia vaccine. ? Shingles vaccine. ? Tetanus, diphtheria, and pertussis (Tdap) booster vaccine. Your health care provider may also recommend other immunizations. Tell your health care provider if you have ever been abused or do not feel safe at home. Summary  Menopause is a normal process in which your ability to get pregnant comes to an end.  This condition causes hot flashes, night  sweats, decreased interest in sex, mood swings, headaches, or lack of sleep.  Treatment for this condition may include hormone replacement therapy.  Take actions to keep yourself healthy, including exercising regularly, eating a healthy diet, watching your weight, and checking your blood pressure and blood sugar levels.  Get screened for cancer and depression. Make sure that you are up to date with all your vaccines. This information is not intended to replace advice given to you by your health care provider. Make sure you discuss any questions you have with your health care provider. Document Released: 04/25/2005 Document Revised: 02/24/2018 Document Reviewed: 02/24/2018 Elsevier Patient Education  2020 [employer].

## 2019-04-25 ENCOUNTER — Telehealth: Payer: Self-pay | Admitting: *Deleted

## 2019-04-25 MED ORDER — ESTRADIOL 0.1 MG/GM VA CREA: TOPICAL_CREAM | VAGINAL | 5 refills | Status: DC

## 2019-04-25 NOTE — Telephone Encounter (Signed)
Rx sent 

## 2019-04-25 NOTE — Telephone Encounter (Signed)
Okay for the estradiol cream [id number], 1 applicator per vagina 3 times weekly

## 2019-04-25 NOTE — Telephone Encounter (Signed)
Pharmacy sent fax stating premarin vaginal cream is not covered it is a non-formulary drug. Recommended formulary drugs are estradiol cream 0.01%, estradiol tablets 10 mcg. Please advise

## 2019-05-12 ENCOUNTER — Ambulatory Visit: Payer: BC Managed Care – PPO | Attending: Family

## 2019-05-12 DIAGNOSIS — Z23 Encounter for immunization: Secondary | ICD-10-CM

## 2019-05-12 NOTE — Progress Notes (Signed)
   Covid-19 Vaccination Clinic  Name:  [name]    MRN: [id number] DOB: June 26, 1965  05/12/2019  [name] was observed post Covid-19 immunization for 15 minutes without incidence. She was provided with Vaccine Information Sheet and instruction to access the V-Safe system.   [name] was instructed to call 911 with any severe reactions post vaccine: Marland Kitchen Difficulty breathing  . Swelling of your face and throat  . A fast heartbeat  . A bad rash all over your body  . Dizziness and weakness    Immunizations Administered    Name Date Dose VIS Date Route   Moderna COVID-19 Vaccine 05/12/2019  3:09 PM 0.5 mL 02/15/2019 Intramuscular   Manufacturer: Moderna   [id number]   [id number]

## 2019-06-14 ENCOUNTER — Ambulatory Visit: Payer: BC Managed Care – PPO | Attending: Family

## 2019-06-14 DIAGNOSIS — Z23 Encounter for immunization: Secondary | ICD-10-CM

## 2019-06-14 NOTE — Progress Notes (Signed)
   Covid-19 Vaccination Clinic  Name:  [name]    MRN: [id number] DOB: Feb 03, 1966  06/14/2019  [name] was observed post Covid-19 immunization for 15 minutes without incident. She was provided with Vaccine Information Sheet and instruction to access the V-Safe system.   [name] was instructed to call 911 with any severe reactions post vaccine: Marland Kitchen Difficulty breathing  . Swelling of face and throat  . A fast heartbeat  . A bad rash all over body  . Dizziness and weakness   Immunizations Administered    Name Date Dose VIS Date Route   Moderna COVID-19 Vaccine 06/14/2019 10:34 AM 0.5 mL 02/15/2019 Intramuscular   Manufacturer: Moderna   [id number]   [id number]

## 2019-10-26 ENCOUNTER — Other Ambulatory Visit: Payer: Self-pay

## 2019-11-30 ENCOUNTER — Other Ambulatory Visit: Payer: Self-pay | Admitting: *Deleted

## 2019-12-24 IMAGING — CR DG CHEST 2V
2 series · 2 of 2 positions shown · non-contrast
Comparison: None.

CLINICAL DATA: Preoperative exam prior to thyroid surgery for
malignancy next week.

EXAM:
CHEST - 2 VIEW

[w chest pa]
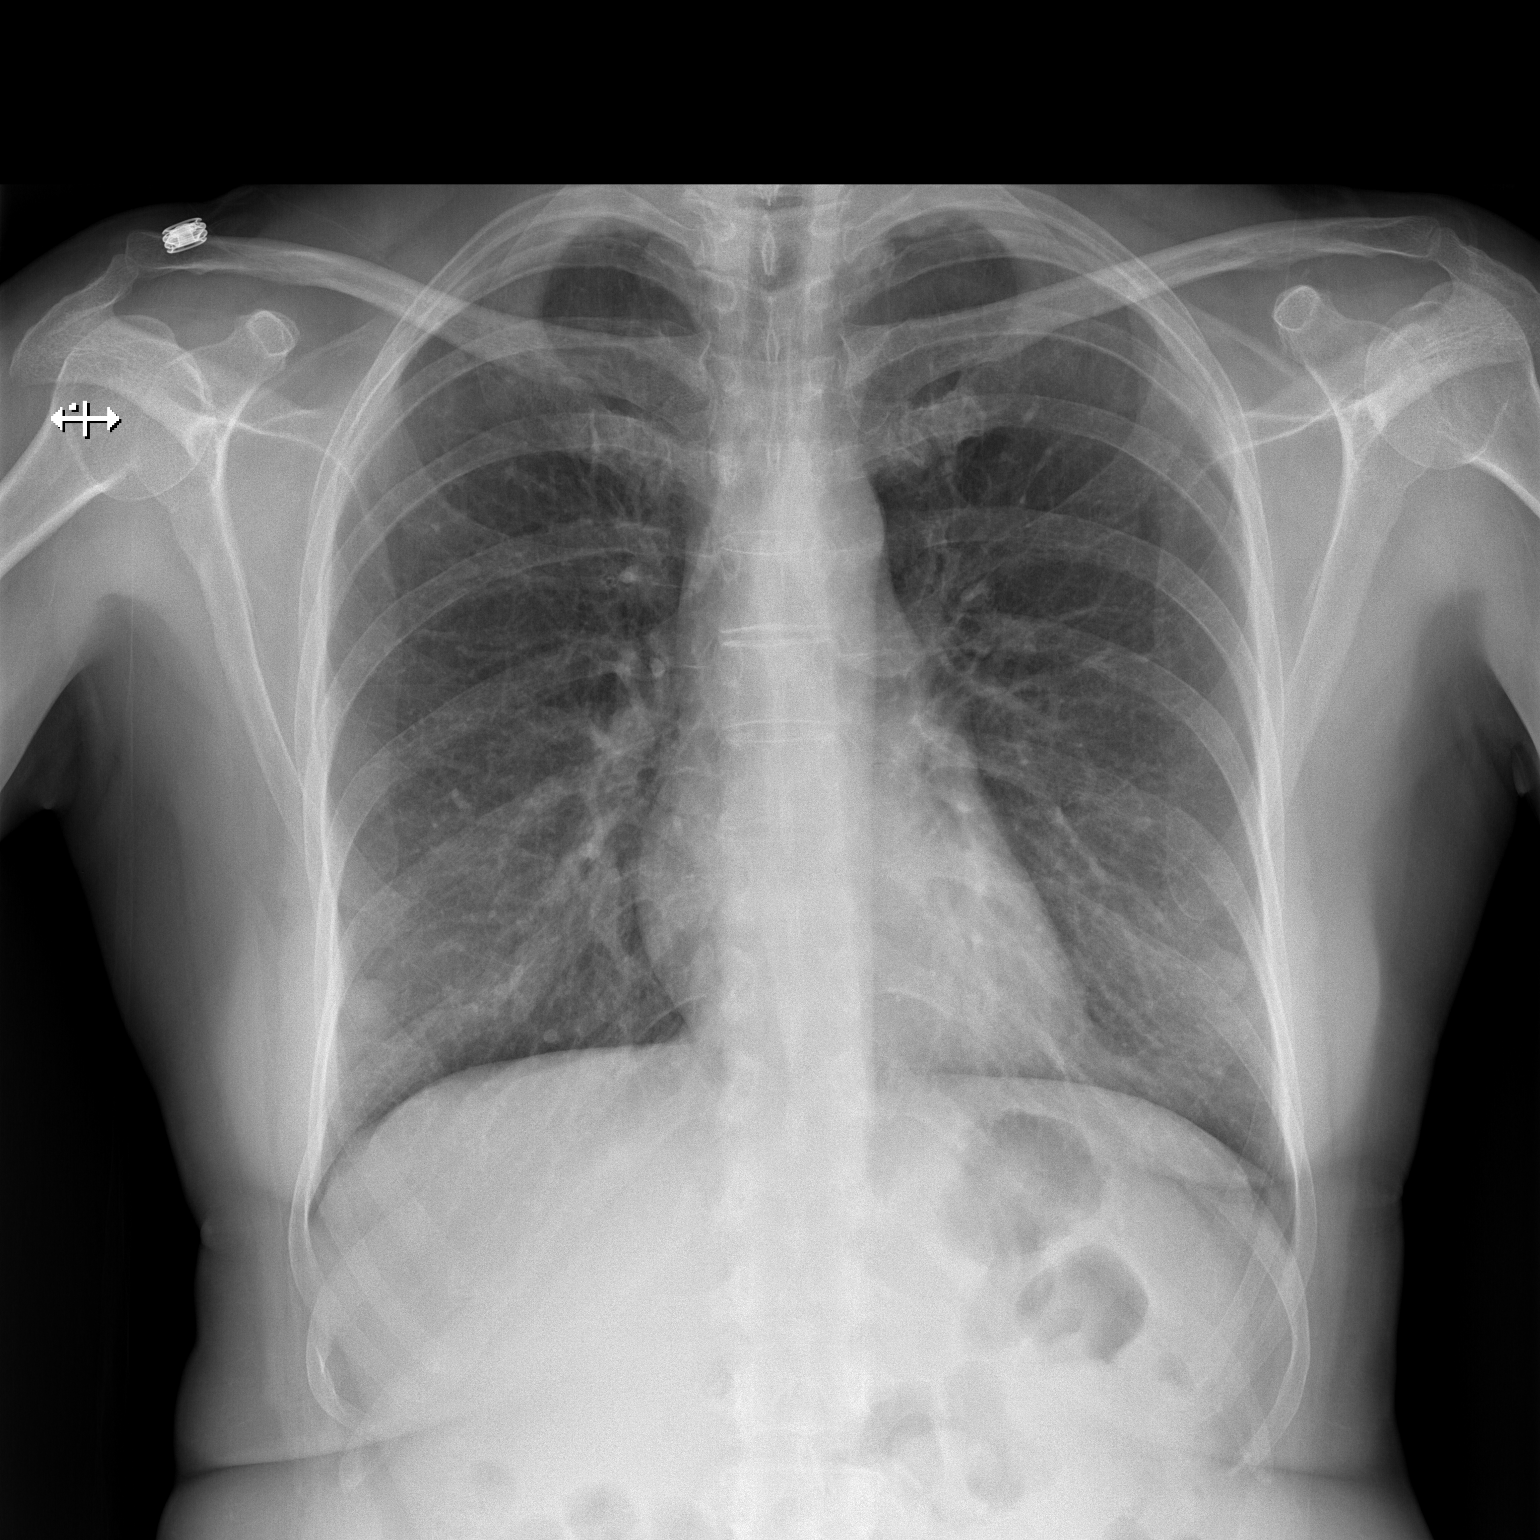

[w chest lat]
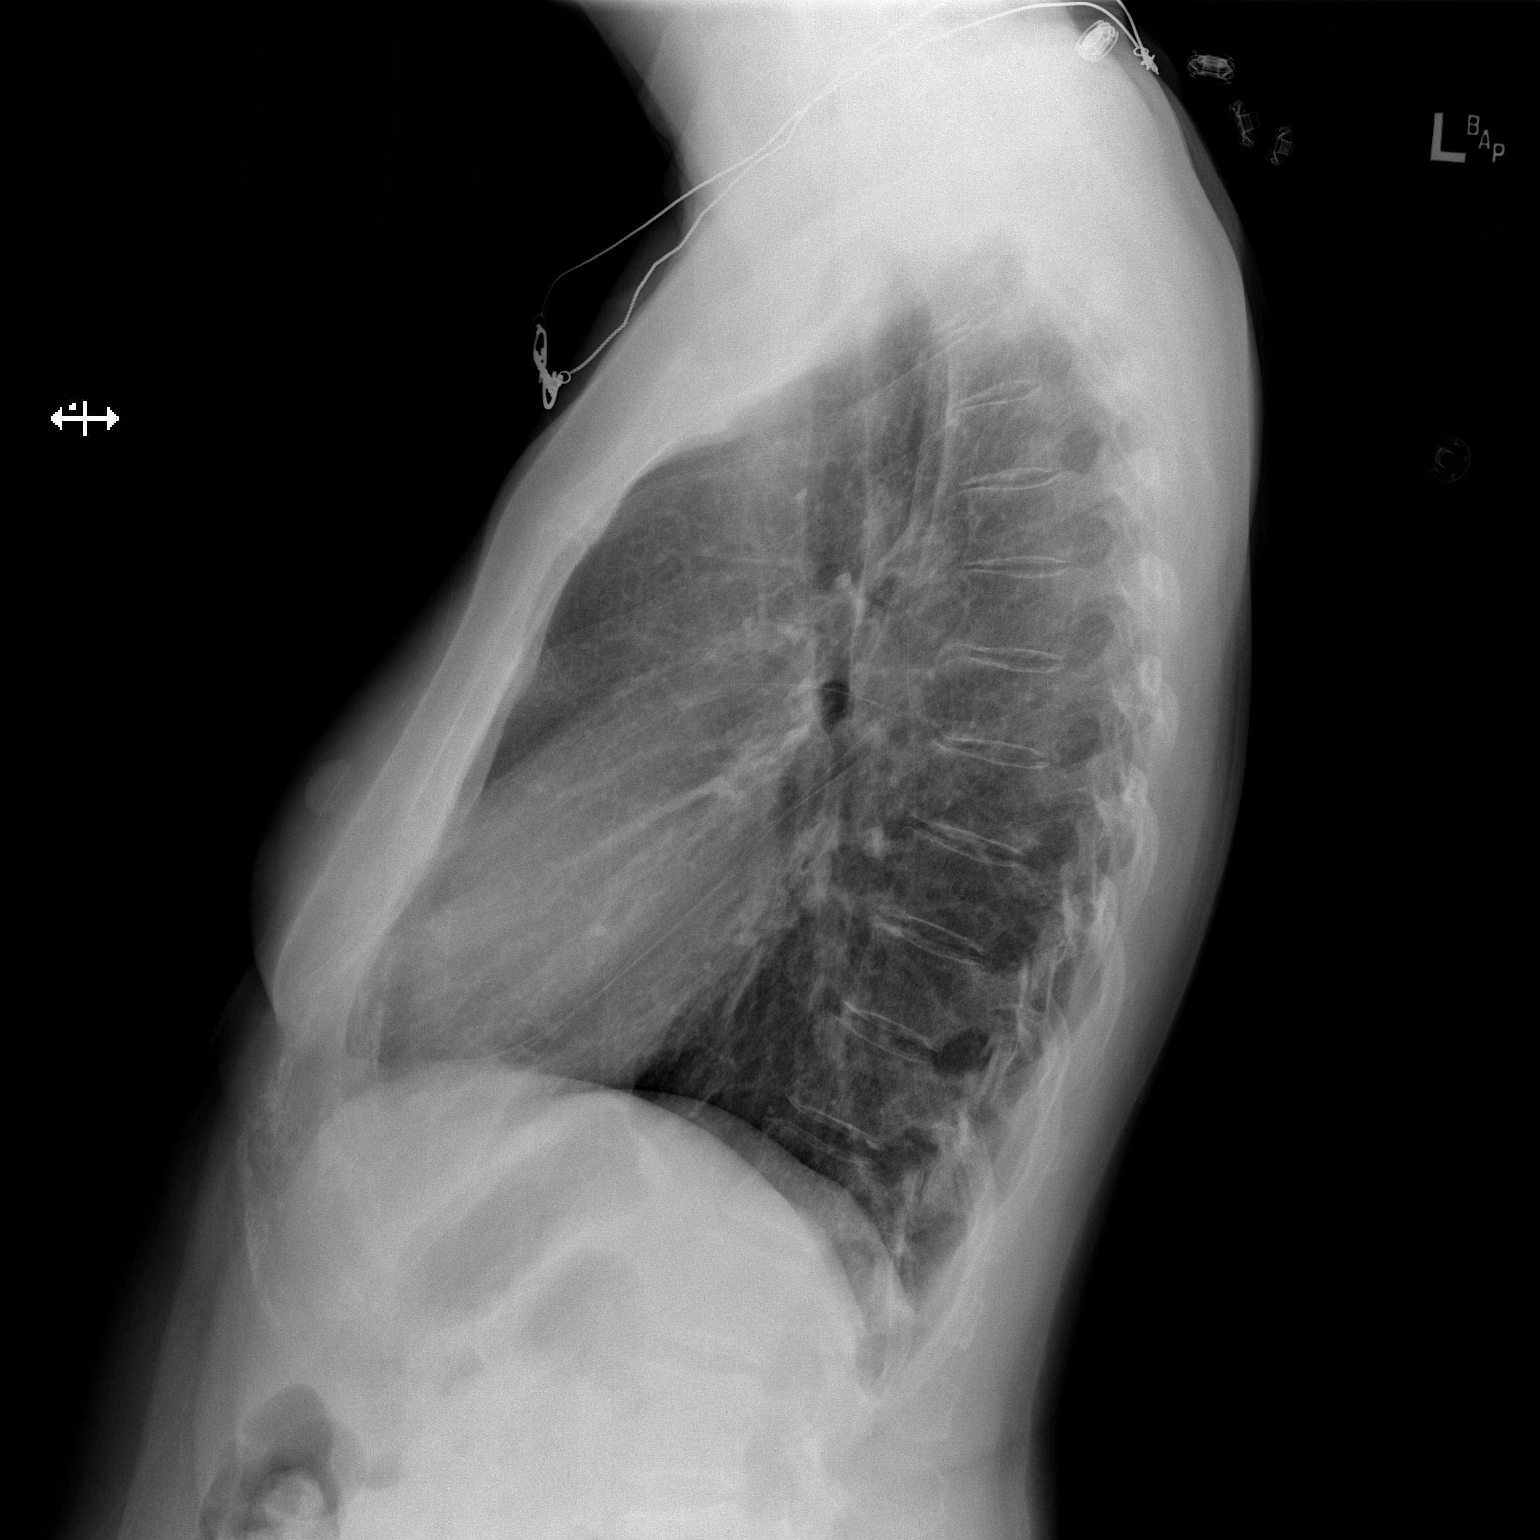

[2 of 2 positions shown; findings below may reference images not displayed]

FINDINGS: The lungs are well-expanded. There is no focal infiltrate. Nipple
shadows are visible bilaterally. The heart and pulmonary vascularity
are normal. The mediastinum is normal in width. The trachea is
midline. The bony thorax exhibits no acute abnormality.
IMPRESSION: There is no active cardiopulmonary disease.

## 2022-02-20 ENCOUNTER — Ambulatory Visit: Payer: BC Managed Care – PPO | Admitting: Radiology

## 2022-02-20 ENCOUNTER — Other Ambulatory Visit (HOSPITAL_COMMUNITY)
Admission: RE | Admit: 2022-02-20 | Discharge: 2022-02-20 | Disposition: A | Discharge status: Home / Self Care | Payer: BC Managed Care – PPO | Source: Ambulatory Visit | Attending: Radiology | Admitting: Radiology

## 2022-02-20 ENCOUNTER — Encounter: Payer: Self-pay | Admitting: Radiology

## 2022-02-20 VITALS — BP 108/68 | Ht 62.5 in | Wt 140.0 lb

## 2022-02-20 DIAGNOSIS — N958 Other specified menopausal and perimenopausal disorders: Secondary | ICD-10-CM | POA: Diagnosis not present

## 2022-02-20 DIAGNOSIS — Z23 Encounter for immunization: Secondary | ICD-10-CM

## 2022-02-20 DIAGNOSIS — Z01419 Encounter for gynecological examination (general) (routine) without abnormal findings: Secondary | ICD-10-CM | POA: Insufficient documentation

## 2022-02-20 MED ORDER — ESTRADIOL 0.1 MG/GM VA CREA: 1.0000 g | TOPICAL_CREAM | VAGINAL | 12 refills | Status: DC

## 2022-02-20 NOTE — Progress Notes (Signed)
   Ronesha Heenan Pense [birth year] [id number]   History:  56 y.o. G3P2 presents for annual exam.Complains of light vaginal bleeding x's 4 days. Hx TAH with bilateral salpingectomy P 08/05/13 M 10/25/13 C 10/2021 per patient  Hx of thryroid cancer, scheduled to see Onc tomorrow.  Gynecologic History Hysterectomy: 2015  Sexually active: yes   Past medical history, past surgical history, family history and social history were all reviewed and documented in the EPIC chart.  ROS:  A ROS was performed and pertinent positives and negatives are included.  Exam:  Vitals:   02/20/22 0853  BP: 108/68  Weight: 140 lb (63.5 kg)  Height: 5' 2.5" (1.588 m)   Body mass index is 25.2 kg/m.  General appearance:  Normal Thyroid:  Symmetrical, normal in size, without palpable masses or nodularity. Respiratory  Auscultation:  Clear without wheezing or rhonchi Cardiovascular  Auscultation:  Regular rate, without rubs, murmurs or gallops  Edema/varicosities:  Not grossly evident Abdominal  Soft,nontender, without masses, guarding or rebound.  Liver/spleen:  No organomegaly noted  Hernia:  None appreciated  Skin  Inspection:  Grossly normal Breasts: Examined lying and sitting.   Right: Without masses, retractions, nipple discharge or axillary adenopathy.   Left: Without masses, retractions, nipple discharge or axillary adenopathy. Genitourinary   Inguinal/mons:  Normal without inguinal adenopathy  External genitalia:  Normal appearing vulva with no masses, tenderness, or lesions  BUS/Urethra/Skene's glands:  Normal  Vagina:  Normal appearing with normal color and discharge, no lesions. Atrophy moderate, scant bleeding at introitus   Cervix:  absent  Uterus:  absent  Adnexa/parametria:     Rt: Normal in size, without masses or tenderness.   Lt: Normal in size, without masses or tenderness.  Anus and perineum: Normal  Digital rectal exam: Normal sphincter tone without palpated masses or  tenderness  Patient informed chaperone available to be present for breast and pelvic exam. Patient has requested no chaperone to be present. Patient has been advised what will be completed during breast and pelvic exam.   Assessment/Plan:   1. Well woman exam with routine gynecological exam - Cytology - PAP( )  2. Genitourinary syndrome of menopause - estradiol (ESTRACE VAGINAL) 0.1 MG/GM vaginal cream; Place 1 g vaginally 2 (two) times a week. At bedtime  Dispense: 42.5 g; Refill: 12  3. Need for immunization against influenza - Flu Vaccine QUAD 16moIM (Fluarix, Fluzone & Alfiuria Quad PF)    Discussed SBE, colonoscopy and DEXA screening as appropriate. Encouraged 1565ms/week of cardiovascular and weight bearing exercise minimum. Recommend the use of seatbelts and sunscreen consistently.   Return in 1 year for annual or sooner prn.  CHRubbie Battiest WHNP-BC 9:20 AM 02/20/2022

## 2022-02-24 LAB — CYTOLOGY - PAP
Comment: NEGATIVE
Diagnosis: NEGATIVE
High risk HPV: NEGATIVE

## 2023-11-17 ENCOUNTER — Encounter: Payer: Self-pay | Admitting: Radiology

## 2023-11-17 ENCOUNTER — Ambulatory Visit (INDEPENDENT_AMBULATORY_CARE_PROVIDER_SITE_OTHER): Payer: Self-pay | Admitting: Radiology

## 2023-11-17 VITALS — BP 102/60 | HR 84 | Ht 63.5 in | Wt 147.0 lb

## 2023-11-17 DIAGNOSIS — Z1331 Encounter for screening for depression: Secondary | ICD-10-CM | POA: Diagnosis not present

## 2023-11-17 DIAGNOSIS — N958 Other specified menopausal and perimenopausal disorders: Secondary | ICD-10-CM

## 2023-11-17 DIAGNOSIS — Z01419 Encounter for gynecological examination (general) (routine) without abnormal findings: Secondary | ICD-10-CM

## 2023-11-17 MED ORDER — ESTRADIOL 0.1 MG/GM VA CREA: 1.0000 g | TOPICAL_CREAM | VAGINAL | 5 refills | Status: AC

## 2023-11-17 NOTE — Progress Notes (Signed)
   [name] Aug 21, 1965 [id number]   History:  58 y.o. G3P2 presents for annual exam. Using vaginal estrogen, needs a refill. Overdue for mammogram. Colonoscopy showed polyps, repeat 3 years.  Gynecologic History Hysterectomy: 2015  Sexually active: yes  Health Maintenance Last Pap: 2023. Results were: normal Last mammogram: 8/15.  Last colonoscopy: 2025. Results were: normal per pt      11/17/2023    3:09 PM  Depression screen PHQ 2/9  Decreased Interest 0  Down, Depressed, Hopeless 0  PHQ - 2 Score 0     Past medical history, past surgical history, family history and social history were all reviewed and documented in the EPIC chart.  ROS:  A ROS was performed and pertinent positives and negatives are included.  Exam:  Vitals:   11/17/23 1509  BP: 102/60  Pulse: 84  SpO2: 97%  Weight: 147 lb (66.7 kg)  Height: 5' 3.5 (1.613 m)   Body mass index is 25.63 kg/m.  General appearance:  Normal Thyroid :  Symmetrical, normal in size, without palpable masses or nodularity. Respiratory  Auscultation:  Clear without wheezing or rhonchi Cardiovascular  Auscultation:  Regular rate, without rubs, murmurs or gallops  Edema/varicosities:  Not grossly evident Abdominal  Soft,nontender, without masses, guarding or rebound.  Liver/spleen:  No organomegaly noted  Hernia:  None appreciated  Skin  Inspection:  Grossly normal Breasts: Examined lying and sitting.   Right: Without masses, retractions, nipple discharge or axillary adenopathy.   Left: Without masses, retractions, nipple discharge or axillary adenopathy. Genitourinary   Inguinal/mons:  Normal without inguinal adenopathy  External genitalia:  Normal appearing vulva with no masses, tenderness, or lesions  BUS/Urethra/Skene's glands:  Normal  Vagina:  Normal appearing with normal color and discharge, no lesions.   Cervix:  absent  Uterus:  absent  Adnexa/parametria:     Rt: Normal in size, without masses or  tenderness.   Lt: Normal in size, without masses or tenderness.  Anus and perineum: Normal   [name], CMA present for exam  Assessment/Plan:   1. Well woman exam with routine gynecological exam (Primary)  2. Genitourinary syndrome of menopause - estradiol  (ESTRACE  VAGINAL) 0.1 MG/GM vaginal cream; Place 1 g vaginally 2 (two) times a week. At bedtime  Dispense: 42.5 g; Refill: 5  3. Depression screen   Return in 1 year for annual or sooner prn.  Emrik Erhard B WHNP-BC 3:12 PM 11/17/2023
